# Patient Record
Sex: Female | Born: 1975 | Race: White | Hispanic: No | Marital: Married | Smoking: Former smoker
Health system: Southern US, Community
[De-identification: ages and names within clinical notes are randomized; demographics above are authoritative.]

## PROBLEM LIST (undated history)

## (undated) DIAGNOSIS — K219 Gastro-esophageal reflux disease without esophagitis: Secondary | ICD-10-CM

## (undated) DIAGNOSIS — R011 Cardiac murmur, unspecified: Secondary | ICD-10-CM

## (undated) DIAGNOSIS — F419 Anxiety disorder, unspecified: Secondary | ICD-10-CM

## (undated) DIAGNOSIS — J45909 Unspecified asthma, uncomplicated: Secondary | ICD-10-CM

## (undated) DIAGNOSIS — D649 Anemia, unspecified: Secondary | ICD-10-CM

## (undated) DIAGNOSIS — I341 Nonrheumatic mitral (valve) prolapse: Secondary | ICD-10-CM

## (undated) HISTORY — PX: APPENDECTOMY: SHX54

## (undated) HISTORY — DX: Nonrheumatic mitral (valve) prolapse: I34.1

---
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
## 2002-07-14 ENCOUNTER — Other Ambulatory Visit: Admission: RE | Admit: 2002-07-14 | Discharge: 2002-07-14 | Payer: Self-pay | Admitting: Family Medicine

## 2003-07-06 ENCOUNTER — Other Ambulatory Visit: Admission: RE | Admit: 2003-07-06 | Discharge: 2003-07-06 | Payer: Self-pay | Admitting: Family Medicine

## 2004-05-05 ENCOUNTER — Ambulatory Visit: Payer: Self-pay | Admitting: Family Medicine

## 2004-11-24 ENCOUNTER — Encounter: Payer: Self-pay | Admitting: Family Medicine

## 2004-11-24 ENCOUNTER — Ambulatory Visit: Payer: Self-pay | Admitting: Family Medicine

## 2004-11-24 ENCOUNTER — Other Ambulatory Visit: Admission: RE | Admit: 2004-11-24 | Discharge: 2004-11-24 | Payer: Self-pay | Admitting: Family Medicine

## 2005-09-16 ENCOUNTER — Ambulatory Visit: Payer: Self-pay | Admitting: Family Medicine

## 2006-03-08 ENCOUNTER — Ambulatory Visit: Payer: Self-pay | Admitting: Family Medicine

## 2006-03-08 ENCOUNTER — Other Ambulatory Visit: Admission: RE | Admit: 2006-03-08 | Discharge: 2006-03-08 | Payer: Self-pay | Admitting: Family Medicine

## 2006-03-08 ENCOUNTER — Encounter: Payer: Self-pay | Admitting: Family Medicine

## 2006-03-08 LAB — CONVERTED CEMR LAB: Pap Smear: NORMAL

## 2006-03-26 ENCOUNTER — Ambulatory Visit: Payer: Self-pay | Admitting: Family Medicine

## 2007-01-18 ENCOUNTER — Encounter: Payer: Self-pay | Admitting: Family Medicine

## 2007-01-25 ENCOUNTER — Ambulatory Visit: Payer: Self-pay | Admitting: Family Medicine

## 2007-01-25 DIAGNOSIS — J309 Allergic rhinitis, unspecified: Secondary | ICD-10-CM | POA: Insufficient documentation

## 2007-01-25 DIAGNOSIS — J45909 Unspecified asthma, uncomplicated: Secondary | ICD-10-CM | POA: Insufficient documentation

## 2007-01-25 LAB — CONVERTED CEMR LAB: Beta hcg, urine, semiquantitative: POSITIVE

## 2007-02-01 ENCOUNTER — Ambulatory Visit: Payer: Self-pay | Admitting: Obstetrics & Gynecology

## 2007-02-10 ENCOUNTER — Encounter: Payer: Self-pay | Admitting: Obstetrics & Gynecology

## 2007-02-10 ENCOUNTER — Ambulatory Visit: Payer: Self-pay | Admitting: Obstetrics & Gynecology

## 2007-02-11 ENCOUNTER — Ambulatory Visit (HOSPITAL_COMMUNITY): Admission: RE | Admit: 2007-02-11 | Discharge: 2007-02-11 | Payer: Self-pay | Admitting: Gynecology

## 2007-02-16 ENCOUNTER — Encounter: Payer: Self-pay | Admitting: Family Medicine

## 2007-03-08 ENCOUNTER — Ambulatory Visit (HOSPITAL_COMMUNITY): Admission: RE | Admit: 2007-03-08 | Discharge: 2007-03-08 | Payer: Self-pay | Admitting: Obstetrics & Gynecology

## 2007-03-10 ENCOUNTER — Ambulatory Visit: Payer: Self-pay | Admitting: Obstetrics & Gynecology

## 2007-03-10 ENCOUNTER — Ambulatory Visit (HOSPITAL_COMMUNITY): Admission: RE | Admit: 2007-03-10 | Discharge: 2007-03-10 | Payer: Self-pay | Admitting: Obstetrics & Gynecology

## 2007-04-01 ENCOUNTER — Ambulatory Visit: Payer: Self-pay | Admitting: Family Medicine

## 2007-04-06 ENCOUNTER — Ambulatory Visit: Payer: Self-pay | Admitting: Obstetrics & Gynecology

## 2007-04-08 ENCOUNTER — Ambulatory Visit (HOSPITAL_COMMUNITY): Admission: RE | Admit: 2007-04-08 | Discharge: 2007-04-08 | Payer: Self-pay | Admitting: Obstetrics & Gynecology

## 2007-04-22 ENCOUNTER — Ambulatory Visit (HOSPITAL_COMMUNITY): Admission: RE | Admit: 2007-04-22 | Discharge: 2007-04-22 | Payer: Self-pay | Admitting: Obstetrics & Gynecology

## 2007-04-28 ENCOUNTER — Ambulatory Visit: Payer: Self-pay | Admitting: Obstetrics & Gynecology

## 2007-05-26 ENCOUNTER — Ambulatory Visit: Payer: Self-pay | Admitting: Obstetrics & Gynecology

## 2007-05-30 ENCOUNTER — Ambulatory Visit: Payer: Self-pay | Admitting: Obstetrics & Gynecology

## 2007-05-30 ENCOUNTER — Inpatient Hospital Stay: Admission: AD | Admit: 2007-05-30 | Discharge: 2007-05-30 | Payer: Self-pay | Admitting: Obstetrics & Gynecology

## 2007-06-03 ENCOUNTER — Ambulatory Visit (HOSPITAL_COMMUNITY): Admission: RE | Admit: 2007-06-03 | Discharge: 2007-06-03 | Payer: Self-pay | Admitting: Obstetrics & Gynecology

## 2007-06-28 ENCOUNTER — Ambulatory Visit: Payer: Self-pay | Admitting: Family Medicine

## 2007-07-05 ENCOUNTER — Ambulatory Visit: Payer: Self-pay | Admitting: Family Medicine

## 2007-07-13 ENCOUNTER — Ambulatory Visit: Payer: Self-pay | Admitting: Obstetrics & Gynecology

## 2007-08-10 ENCOUNTER — Ambulatory Visit: Payer: Self-pay | Admitting: Gynecology

## 2007-08-17 ENCOUNTER — Ambulatory Visit: Payer: Self-pay | Admitting: Obstetrics & Gynecology

## 2007-08-26 ENCOUNTER — Inpatient Hospital Stay (HOSPITAL_COMMUNITY): Admission: AD | Admit: 2007-08-26 | Discharge: 2007-08-27 | Payer: Self-pay | Admitting: Obstetrics & Gynecology

## 2007-08-26 ENCOUNTER — Ambulatory Visit: Payer: Self-pay | Admitting: Obstetrics and Gynecology

## 2007-08-29 ENCOUNTER — Ambulatory Visit: Payer: Self-pay | Admitting: Gynecology

## 2007-08-30 ENCOUNTER — Ambulatory Visit (HOSPITAL_COMMUNITY): Admission: RE | Admit: 2007-08-30 | Discharge: 2007-08-30 | Payer: Self-pay | Admitting: Gynecology

## 2007-08-31 ENCOUNTER — Ambulatory Visit: Payer: Self-pay | Admitting: Gynecology

## 2007-09-07 ENCOUNTER — Ambulatory Visit: Payer: Self-pay | Admitting: Gynecology

## 2007-09-08 ENCOUNTER — Ambulatory Visit: Payer: Self-pay | Admitting: Gynecology

## 2007-09-13 ENCOUNTER — Inpatient Hospital Stay (HOSPITAL_COMMUNITY): Admission: RE | Admit: 2007-09-13 | Discharge: 2007-09-16 | Payer: Self-pay | Admitting: Gynecology

## 2007-09-13 ENCOUNTER — Ambulatory Visit: Payer: Self-pay | Admitting: Gynecology

## 2007-09-13 DIAGNOSIS — O1490 Unspecified pre-eclampsia, unspecified trimester: Secondary | ICD-10-CM

## 2007-09-20 ENCOUNTER — Ambulatory Visit: Payer: Self-pay | Admitting: Gynecology

## 2007-10-25 ENCOUNTER — Ambulatory Visit: Payer: Self-pay | Admitting: Obstetrics & Gynecology

## 2007-11-18 ENCOUNTER — Encounter (INDEPENDENT_AMBULATORY_CARE_PROVIDER_SITE_OTHER): Payer: Self-pay | Admitting: *Deleted

## 2007-11-30 ENCOUNTER — Ambulatory Visit: Payer: Self-pay | Admitting: Family Medicine

## 2007-11-30 DIAGNOSIS — B351 Tinea unguium: Secondary | ICD-10-CM | POA: Insufficient documentation

## 2007-11-30 DIAGNOSIS — M6530 Trigger finger, unspecified finger: Secondary | ICD-10-CM | POA: Insufficient documentation

## 2008-02-15 ENCOUNTER — Ambulatory Visit: Payer: Self-pay | Admitting: Obstetrics and Gynecology

## 2008-02-15 ENCOUNTER — Encounter: Payer: Self-pay | Admitting: Obstetrics and Gynecology

## 2008-03-15 ENCOUNTER — Ambulatory Visit: Payer: Self-pay | Admitting: Family Medicine

## 2008-03-15 DIAGNOSIS — I498 Other specified cardiac arrhythmias: Secondary | ICD-10-CM | POA: Insufficient documentation

## 2008-03-21 ENCOUNTER — Encounter: Payer: Self-pay | Admitting: Family Medicine

## 2008-04-02 ENCOUNTER — Ambulatory Visit: Payer: Self-pay | Admitting: Family Medicine

## 2008-04-02 DIAGNOSIS — M542 Cervicalgia: Secondary | ICD-10-CM | POA: Insufficient documentation

## 2008-04-02 DIAGNOSIS — M722 Plantar fascial fibromatosis: Secondary | ICD-10-CM | POA: Insufficient documentation

## 2008-04-02 DIAGNOSIS — R4589 Other symptoms and signs involving emotional state: Secondary | ICD-10-CM | POA: Insufficient documentation

## 2008-12-26 ENCOUNTER — Encounter: Payer: Self-pay | Admitting: Family Medicine

## 2009-03-27 ENCOUNTER — Ambulatory Visit: Payer: Self-pay | Admitting: Obstetrics and Gynecology

## 2009-10-26 IMAGING — US US OB COMP LESS 14 WK
1 series · 6 of 6 positions shown · non-contrast
Comparison: none

OBSTETRICAL ULTRASOUND:
 This ultrasound was performed in The [HOSPITAL], and the [facility] report will be stored to [REDACTED] PACS.

[Series 1: us ob comp less 14 wk · 6 of 6 slices shown]
[im 1/6]
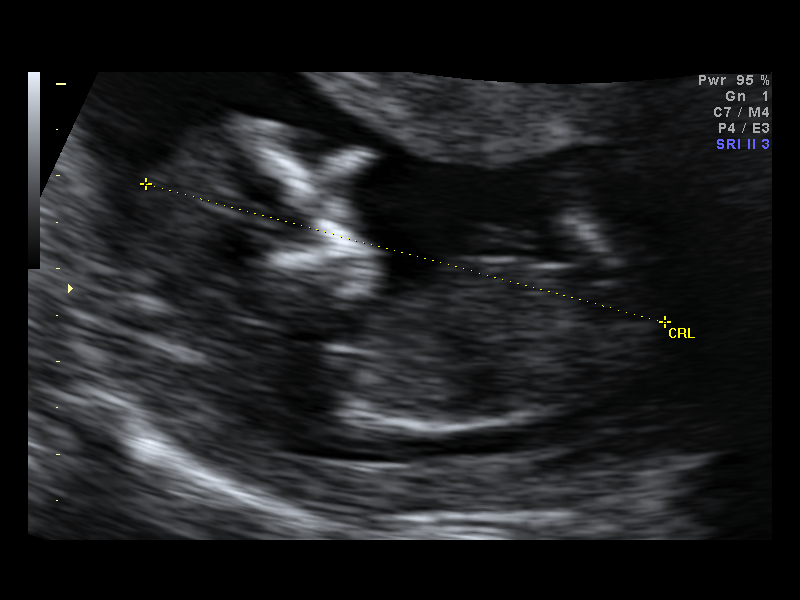
[im 2/6]
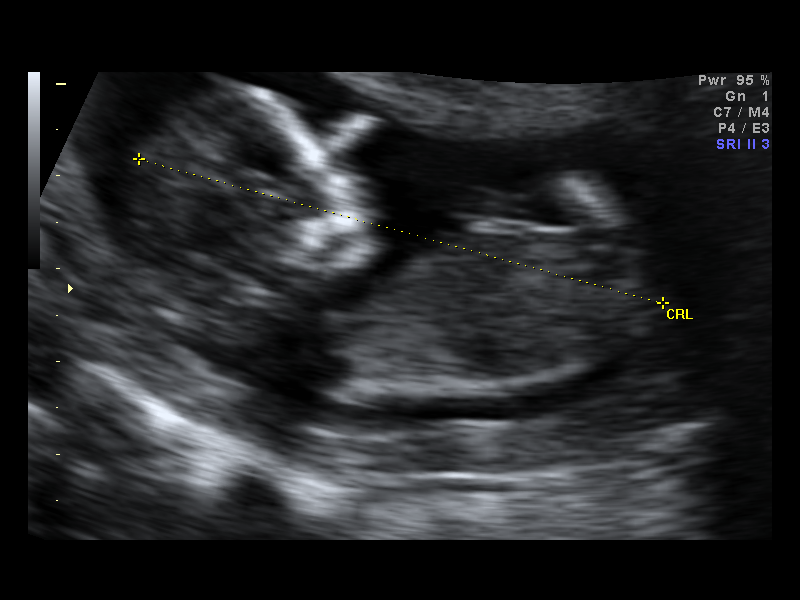
[im 3/6]
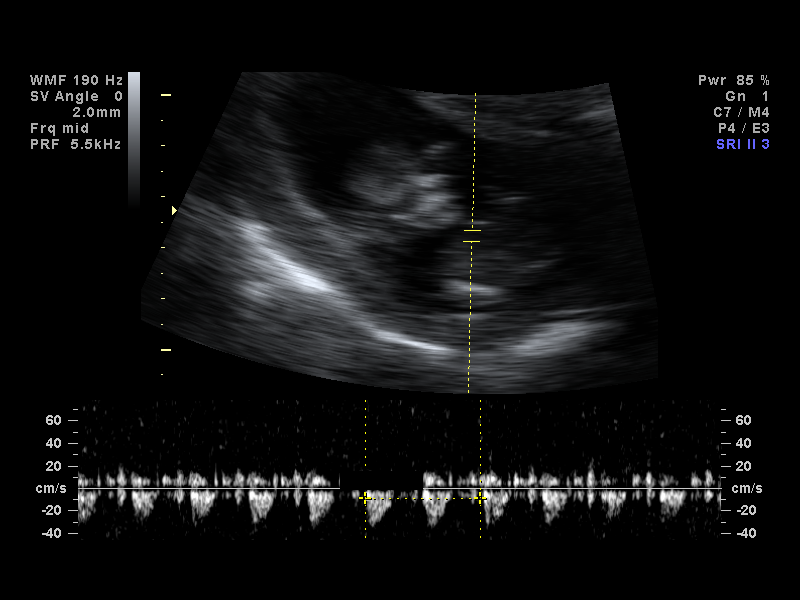
[im 4/6]
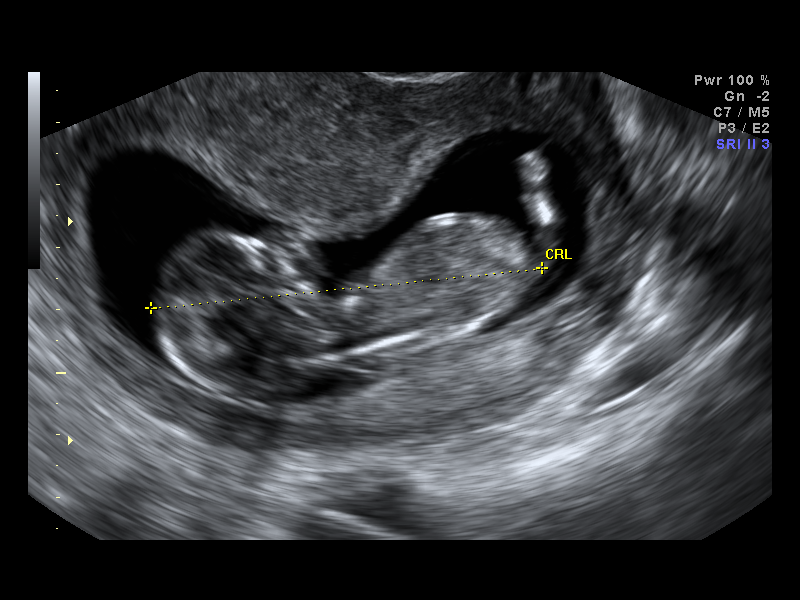
[im 5/6]
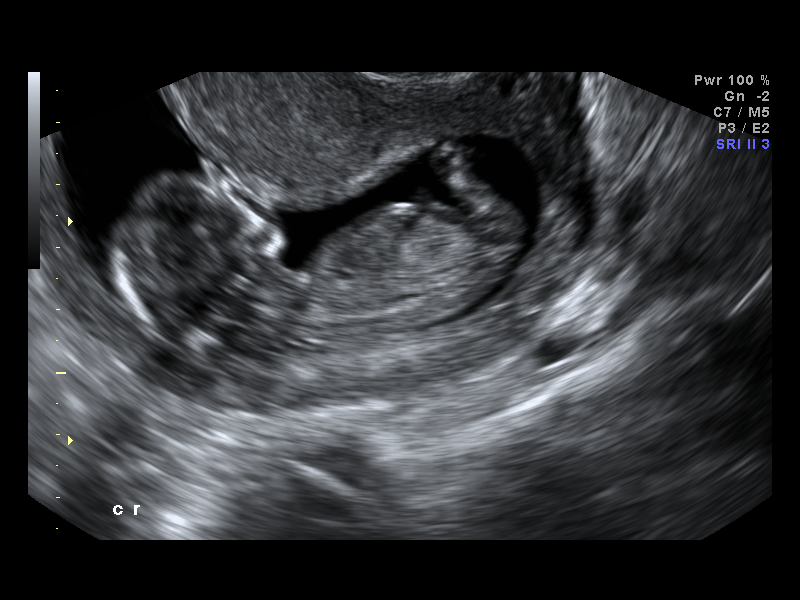
[im 6/6]
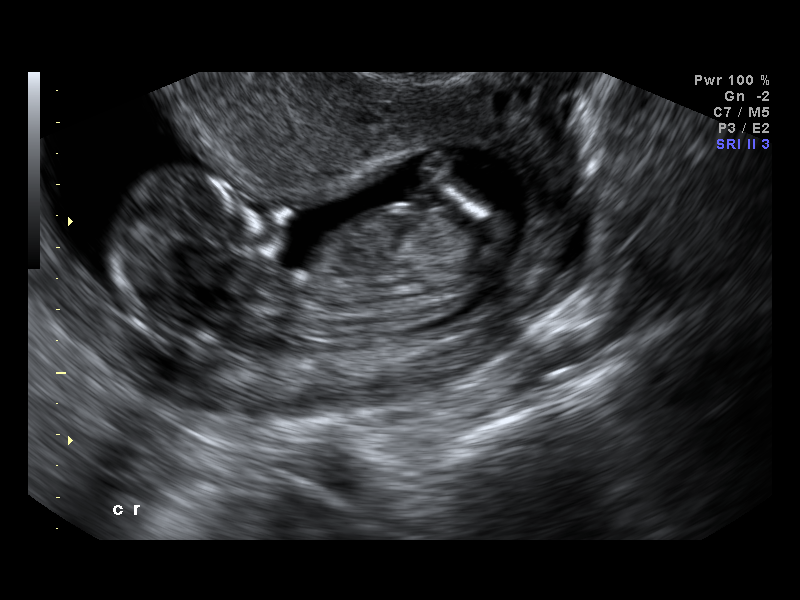

[6 of 6 positions shown; findings below may reference images not displayed]

IMPRESSION: The [facility] report has also been faxed to the ordering physician.

## 2010-03-06 NOTE — Letter (Signed)
[facility]   Imported By: Maryln Gottron 01/28/2009 14:23:48  _____________________________________________________________________  External Attachment:    Type:   Image     Comment:   External Document

## 2010-03-06 NOTE — Miscellaneous (Signed)
Summary: flu vaccine  Clinical Lists Changes  Observations: Added new observation of FLU VAX: Historical (11/16/2007 12:56)      Influenza Immunization History:    Influenza # 1:  Historical (11/16/2007)  given im L deltoid lot # [id number] Liane Comber  November 18, 2007 12:57 PM

## 2010-03-06 NOTE — Consult Note (Signed)
Summary: [facility]/Dr. Lonzo Candy  [facility]/Dr. Lonzo Candy   Imported By: Eleonore Chiquito 04/17/2008 12:36:15  _____________________________________________________________________  External Attachment:    Type:   Image     Comment:   External Document

## 2010-03-06 NOTE — Consult Note (Signed)
Summary: [facility]/Consultation Report/Dr. Jerral [facility]/Consult[facility]/Dr. Minor Hill Callas   Imported By: Mickle Asper 02/04/2007 10:58:48  _____________________________________________________________________  External Attachment:    Type:   Image     Comment:   External Document

## 2010-03-06 NOTE — Assessment & Plan Note (Signed)
Summary: sole spur/shoulder pain/anxiety/rbh   Vital Signs:  Patient Profile:   35 Years Old Female Weight:      184 pounds Temp:     97.3 degrees F oral Pulse rate:   48 / minute Pulse rhythm:   regular BP sitting:   110 / 80  (left arm) Cuff size:   regular  Vitals Entered By: Liane Comber (April 02, 2008 3:58 PM)                 Chief Complaint:  sole spur, shoulder pain, and anxiery.  History of Present Illness: will be starting INH for 9 mo for pos PPD -- (? if exp to it [place])  2 weeks ago- fell down several stairs- backwards- at time was ok  now is having some shoulder soreness R  pain is posterior generally- and bothers her more at night  heating pad and icy hot does help  no pain with raising her arm above her head   R heel- has been bothering her on occasion- sharp pain when she gets up after sitting -- has to warm up and then ok (is aggrivating)  no swelling or skin change at all  no numbness or weakness  ? if has flat feet  thinks she has some anxiety increased stress- is going to school and works full time (job is stressful) and 6 mo old child is considering moving as well   few times has been tearful and difficult to console  anger at times  not generally nervous , but may be feels a little down and negative  no anxiety attacks  no hx of dep or anx or med or counseling in the past  has never been this overwhelmed  good support in husband and mother  no time to relax/ down time  is sleeping fine , is eating fine , and has no suicidal thoughts         Current Allergies (reviewed today): SEPTRA  Past Medical History:    Allergic rhinitis    Asthma    stress reaction/anx     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      Denies loss of appetite, malaise, and sleep disorder.  Eyes      Denies blurring, double vision, and eye irritation.  ENT      Complains of postnasal drainage and sore throat.      a little drip and st this am- ? cold or allergies   CV      Denies chest pain or discomfort, palpitations, and swelling of feet.  Resp      Denies cough, shortness of breath, and wheezing.  GI      Denies abdominal pain, bloody stools, change in bowel habits, diarrhea, indigestion, and nausea.  MS      Complains of stiffness.      Denies joint redness and joint swelling.  Derm      Denies itching, lesion(s), and rash.  Neuro      Denies headaches, numbness,  tingling, and tremors.  Psych      Complains of anxiety, easily tearful, and irritability.      Denies panic attacks, sense of great danger, and suicidal thoughts/plans.  Endo      Denies excessive thirst and excessive urination.  Heme      Denies pallor and skin discoloration.   Physical Exam  General:     Well-developed,well-nourished,in no acute distress; alert,appropriate and cooperative throughout examination Head:     normocephalic, atraumatic, and no abnormalities observed.  no sinus tenderness  Eyes:     vision grossly intact, pupils equal, pupils round, and pupils reactive to light.  no conjunctival pallor, injection or icterus  Ears:     R ear normal and L ear normal.   Nose:     mild congestion- clear rhinorrhea  Mouth:     pharynx pink and moist.   Neck:     supple with full rom and no masses or thyromegally, no JVD or carotid bruit  Lungs:     Normal respiratory effort, chest expands symmetrically. Lungs are clear to auscultation, no crackles or wheezes. Heart:     Normal rate and regular rhythm. S1 and S2 normal without gallop, murmur, click, rub or other extra sounds. Abdomen:     Bowel sounds positive,abdomen soft and  non-tender without masses, organomegaly or hernias noted. Msk:     some tenderness of R trapezius muscle  pain on flex/ and L rot neck  nl rom shoulder  no bony tenderness  R heel- slt tender- without redness/swelling or skin change  nl gait adequate arches Pulses:     R and L carotid,radial,femoral,dorsalis pedis and posterior tibial pulses are full and equal bilaterally Extremities:     No clubbing, cyanosis, edema, or deformity noted with normal full range of motion of all joints.   Neurologic:     cranial nerves II-XII intact, sensation intact to light touch, gait normal, and DTRs symmetrical and normal.   Skin:     Intact without suspicious lesions or rashes Cervical Nodes:     No lymphadenopathy noted Inguinal Nodes:     No significant adenopathy Psych:     seems generally fatigued and overwhelmed not tearful good eye contact and comm skills      Impression & Recommendations:  Problem # 1:  STRESS REACTION, ACUTE, WITH EMOTIONAL DISTURBANCE (ICD-308.0) Assessment: New with some intermittent feelings of being overwhelmed / feeling down overall good support  disc coping strategies/ symptoms/ situational stress in detail today- also rev symptoms of depression to watch for  offered counseling - pt declined now but will consider (time is factor) encouraged venting to family/ keeping journal/exercise given as needed alprazolam to use sparingly as needed and update   Problem # 2:  NECK PAIN (ICD-723.1) Assessment: New exam consistent with trapezius strain/sprain after fall recommended stretches/ cervical supp pillow/ heat flexeril as needed night if not imp 1-2 weeks consider addn eval/ PT  Her updated medication list for this problem includes:    Flexeril 10 Mg Tabs (Cyclobenzaprine hcl) .Marland Kitchen... 1/2 to 1 by mouth three times a day as needed neck/shoulder pain   Problem # 3:  PLANTAR FASCIITIS, RIGHT (ICD-728.71) Assessment: New R heel- mild symptoms  adv use of  hard soled shoe/ avoid barefoot/ ice/massage will avoid nsaid at this time in light of impending INH use  update if not imp  Problem # 4:  NONSPEC REACT TUBERCULIN SKN TEST W/O ACTV TB (ICD-795.5) Assessment: Unchanged per  pulm- to start INH soon no s/s of active TB  Complete Medication List: 1)  Zyrtec Allergy 10 Mg Tabs (Cetirizine hcl) .... Take one by mouth daily 2)  Isoniazid  .... As directed 3)  Flexeril 10 Mg Tabs (Cyclobenzaprine hcl) .... 1/2 to 1 by mouth three times a day as needed neck/shoulder pain 4)  Alprazolam 0.5 Mg Tabs (Alprazolam) .Marland Kitchen.. 1 by mouth once daily as needed severe anxiety 5)  Nuvaring 0.12-0.015 Mg/24hr Ring (Etonogestrel-ethinyl estradiol) .... As directed   Patient Instructions: 1)  for stress - talk to supportive friends and family members and start writing in a journal , also regular exercise is helpful  2)  use xanax for severe anxiety or emergencies 3)  update me if symptoms of depression worsen or do not improve 4)  call if you are interested in a counseling referral in the future  5)  try the flexeril at night as needed for shoulder/ neck pain- and continue heat  6)  a cervical support pillow made of foam- may also be helpful 7)  for heel pain - wear hard soled shoes and avoid going barefoot  8)  ice (rolling foot over frozen can)- is also hepful several times per day    Prescriptions: ALPRAZOLAM 0.5 MG TABS (ALPRAZOLAM) 1 by mouth once daily as needed severe anxiety  #10 x 0   Entered and Authorized by:   Judith Part MD   Signed by:   Judith Part MD on 04/02/2008   Method used:   Print then Give to Patient   RxID:   [id number] FLEXERIL 10 MG TABS (CYCLOBENZAPRINE HCL) 1/2 to 1 by mouth three times a day as needed neck/shoulder pain  #30 x 0   Entered and Authorized by:   Judith Part MD   Signed by:   Judith Part MD on 04/02/2008   Method used:   Print then Give to Patient   RxID:   [id number]   Prior  Medications (reviewed today): ZYRTEC ALLERGY 10 MG  TABS (CETIRIZINE HCL) take one by mouth daily Current Allergies (reviewed today): SEPTRA Current Medications (including changes made in today's visit):  ZYRTEC ALLERGY 10 MG  TABS (CETIRIZINE HCL) take one by mouth daily * ISONIAZID as directed FLEXERIL 10 MG TABS (CYCLOBENZAPRINE HCL) 1/2 to 1 by mouth three times a day as needed neck/shoulder pain ALPRAZOLAM 0.5 MG TABS (ALPRAZOLAM) 1 by mouth once daily as needed severe anxiety NUVARING 0.12-0.015 MG/24HR RING (ETONOGESTREL-ETHINYL ESTRADIOL) as directed

## 2010-03-06 NOTE — Letter (Signed)
Summary: [facility]  [facility]   Imported By: Lanelle Bal 01/03/2009 11:50:31  _____________________________________________________________________  External Attachment:    Type:   Image     Comment:   External Document

## 2010-03-06 NOTE — Assessment & Plan Note (Signed)
Summary: postive tb skin test/[facility]h   Vital Signs:  Patient Profile:   35 Years Old Female Weight:      183 pounds Temp:     97.9 degrees F oral Pulse rate:   68 / minute Pulse rhythm:   regular BP sitting:   110 / 80  (left arm) Cuff size:   regular  Vitals Entered ByProvidence Crosby (March 15, 2008 12:13 PM)                 Chief Complaint:  POSITIVE TB TEST [facility].  History of Present Illness: Pt here foir "positive " PPD placement by her local Pharmacy and read as 18mm.  Her site is mildly erythematous and minimally homogeneously raised, not really indurated. She denies cough, nightsweats, fever, fatigue, weightloss  or SOB.  She also typically has a  heartrate in the 50's, is not an athlete and woders if something should be done. She is assymptomatic, denies chest pai, palpitations or SOB.    Prior Medications Reviewed Using: Patient Recall  Current Allergies (reviewed today): SEPTRA      Physical Exam  Skin:     14x18 homogeneously mildly erythematous mildly raised nonindurated area of her left mid forearm.    Impression & Recommendations:  Problem # 1:  NONSPEC REACT TUBERCULIN SKN TEST W/O ACTV TB (ICD-795.5) Assessment: New Think her risk of TB low, not true induration on her PPD. Will refer to [facility] for deciding need for trmt and approach. Orders: Pulmonary Referral (Pulmonary)   Problem # 2:  BRADYCARDIA, CHRONIC (ICD-427.89) Assessment: Unchanged Do not think any 9nvestigation warranted as pt assymptomatic and her relative bradycardia has been lifelong w/o complication. Reassured.  Complete Medication List: 1)  Zyrtec Allergy 10 Mg Tabs (Cetirizine hcl) .... Take one by mouth daily   Patient Instructions: 1)  Refer to Pulm. 2)  RTC as needed

## 2010-03-06 NOTE — Assessment & Plan Note (Signed)
Summary: R THUMB/CLE   Vital Signs:  Patient Profile:   35 Years Old Female Weight:      184.13 pounds (83.70 kg) Temp:     98.1 degrees F (36.72 degrees C) oral Pulse rate:   72 / minute Pulse rhythm:   regular BP sitting:   120 / 80  (left arm) Cuff size:   regular  Vitals Entered By: Silas Sacramento (November 30, 2007 8:45 AM)                 Chief Complaint:  R thumb hard to bend worse  in morning.  History of Present Illness: 35 year old female presents with right thumb discomfort: triggers at night and causes some pain. No specific trauma or injury to her knowledge. Is only at the right 1st digit and triggers at the A1 pulley, but also causes some pain distal.  Patient was pregnant had baby twelve weeks ago. Hands swollen a lot at that time.  Thumb did swell more.  When she wakes up in the morning, thumb hurts more.   2. L great toenail fungus, ongoing for months. Has not tried any remedy OTC.     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  Otherwise the pertinent positives of the ROS are noted above. Further ROS may be demarcated in the ROS section of Centricity, If none, then this plus the HPI constitutes the ROS.     Physical Exam  Gen: Well-developed,well-nourished,in no acute distress; alert,appropriate and cooperative throughout examination  HEENT: Normocephalic and atraumatic without obvious abnormalities. No apparent alopecia  or balding. Ears, externally no deformities  Pulm: Breathing comfortably in no respiratory distress  Ext: No clubbing, cyanosis, or edema  Psych: Normally interactive. Cooperative during the interview. Pleasant. Friendly and conversant. Not anxious or depressed appearing. Normal, full affect.   Right hand Ecchymosis or edema: neg ROM wrist/hand/digits: full  Carpals, MCP's, digits: NT Distal Ulna and Radius: NT Ecchymosis or edema: neg No instability Cysts/nodules: neg Digit triggering: 1st digit Finkelstein's test: neg Snuffbox tenderness: neg Scaphoid tubercle: NT Full composite fist, no malrotation Grip, all digits: 5/5 str DIPJT: NT PIP JT: NT MCP JT: NT No tenosynovitis Axial load test: neg Atrophy: neg  Hand sensation: intact   Left great toe with fungus apparent    Impression & Recommendations:  Problem # 1:  TRIGGER FINGER (ICD-727.03) Discussed anatomy, reviewed with anatomical drawings.  Will inject, if not better after 1 injection, if fails 2, would refer for trigger finger release.  The patient was given a handout from Dr. Ailene Ards book "The Sports Medicine Patient Advisor" describing the anatomy and rehabilitation of the following condition: Trigger Finger  Trigger Finger Injection Verbal consent was obtained. Risks, benefits and alternatives were discussed. Prepped with Betadine and Ethyl Chloride used for anesthesia. Under sterile conditions,  patient injected at palmar crease aiming distally with 45 degree angle towards nodule. Medication flowed freely without resistance.  Needle size: 22 gauge 1 1/2 inch Injection: 1/2 cc of Marcaine 0.5% and 1/2 cc of Kenalog 40 mg   Orders: Injection, Tendon / Ligament (20550) Kenalog 10 mg inj (J3301)   Problem # 2:  DERMATOPHYTOSIS OF NAIL (ICD-110.1) Onychomycosis  OTC Vicks VapoRub and Tea tree oil Reviewed risks and benefits of oral meds.  Complete Medication List: 1)  Zyrtec Allergy 10 Mg Tabs  (Cetirizine hcl) .... Take one by mouth daily 2)  Bl Prenatal Vitamins Tabs (Prenatal vit-fe fumarate-fa) .Marland Kitchen.. 1 by mouth qd    ]  Problems:  Problems Added: 1)  Dx of Dermatophytosis of Nail  (ICD-110.1) 2)  Dx of Trigger Finger  (ICD-727.03)

## 2010-03-06 NOTE — Consult Note (Signed)
Summary: [facility] / EVAL & MGMT OF A POSITIVE PPD / DR. Lynann Beaver FAR  [facility] / EVAL & MGMT OF A POSITIVE PPD / DR. Lynann Beaver FARES   Imported By: Carin Primrose 03/23/2008 12:05:06  _____________________________________________________________________  External Attachment:    Type:   Image     Comment:   External Document

## 2010-03-24 ENCOUNTER — Other Ambulatory Visit: Payer: Self-pay | Admitting: Family Medicine

## 2010-03-24 ENCOUNTER — Encounter: Payer: Self-pay | Admitting: Family Medicine

## 2010-03-24 DIAGNOSIS — Z3480 Encounter for supervision of other normal pregnancy, unspecified trimester: Secondary | ICD-10-CM

## 2010-03-24 DIAGNOSIS — O3680X0 Pregnancy with inconclusive fetal viability, not applicable or unspecified: Secondary | ICD-10-CM

## 2010-03-26 LAB — CONVERTED CEMR LAB
Antibody Screen: NEGATIVE
Basophils Absolute: 0 10*3/uL (ref 0.0–0.1)
Basophils Relative: 1 % (ref 0–1)
Eosinophils Absolute: 0.3 10*3/uL (ref 0.0–0.7)
Eosinophils Relative: 5 % (ref 0–5)
HCT: 40.6 % (ref 36.0–46.0)
HIV: NONREACTIVE
Hemoglobin: 14 g/dL (ref 12.0–15.0)
Hepatitis B Surface Ag: NEGATIVE
Lymphocytes Relative: 27 % (ref 12–46)
Lymphs Abs: 1.6 10*3/uL (ref 0.7–4.0)
MCHC: 34.5 g/dL (ref 30.0–36.0)
MCV: 87.5 fL (ref 78.0–100.0)
Monocytes Absolute: 0.7 10*3/uL (ref 0.1–1.0)
Monocytes Relative: 12 % (ref 3–12)
Neutro Abs: 3.3 10*3/uL (ref 1.7–7.7)
Neutrophils Relative %: 56 % (ref 43–77)
Platelets: 240 10*3/uL (ref 150–400)
RBC: 4.64 M/uL (ref 3.87–5.11)
RDW: 12.6 % (ref 11.5–15.5)
Rh Type: POSITIVE
Rubella: 219.3 intl units/mL — ABNORMAL HIGH
WBC: 5.8 10*3/uL (ref 4.0–10.5)

## 2010-03-28 ENCOUNTER — Encounter (HOSPITAL_COMMUNITY): Payer: Self-pay

## 2010-03-28 ENCOUNTER — Ambulatory Visit (HOSPITAL_COMMUNITY)
Admission: RE | Admit: 2010-03-28 | Discharge: 2010-03-28 | Disposition: A | Discharge status: Home / Self Care | Payer: Private Health Insurance - Indemnity | Source: Ambulatory Visit | Attending: Family Medicine | Admitting: Family Medicine

## 2010-03-28 DIAGNOSIS — Z3689 Encounter for other specified antenatal screening: Secondary | ICD-10-CM | POA: Insufficient documentation

## 2010-03-28 DIAGNOSIS — O3680X0 Pregnancy with inconclusive fetal viability, not applicable or unspecified: Secondary | ICD-10-CM

## 2010-04-03 ENCOUNTER — Other Ambulatory Visit: Payer: Self-pay | Admitting: Obstetrics & Gynecology

## 2010-04-03 ENCOUNTER — Encounter: Payer: Private Health Insurance - Indemnity | Admitting: Obstetrics & Gynecology

## 2010-04-03 DIAGNOSIS — Z113 Encounter for screening for infections with a predominantly sexual mode of transmission: Secondary | ICD-10-CM

## 2010-04-03 DIAGNOSIS — Z3682 Encounter for antenatal screening for nuchal translucency: Secondary | ICD-10-CM

## 2010-04-03 DIAGNOSIS — Z3480 Encounter for supervision of other normal pregnancy, unspecified trimester: Secondary | ICD-10-CM

## 2010-04-03 DIAGNOSIS — Z1272 Encounter for screening for malignant neoplasm of vagina: Secondary | ICD-10-CM

## 2010-04-22 ENCOUNTER — Encounter: Payer: Self-pay | Admitting: Obstetrics & Gynecology

## 2010-04-22 ENCOUNTER — Other Ambulatory Visit: Payer: Private Health Insurance - Indemnity

## 2010-04-22 DIAGNOSIS — N390 Urinary tract infection, site not specified: Secondary | ICD-10-CM

## 2010-04-22 DIAGNOSIS — Z3480 Encounter for supervision of other normal pregnancy, unspecified trimester: Secondary | ICD-10-CM

## 2010-04-30 ENCOUNTER — Encounter: Payer: Private Health Insurance - Indemnity | Admitting: Obstetrics & Gynecology

## 2010-04-30 DIAGNOSIS — Z3480 Encounter for supervision of other normal pregnancy, unspecified trimester: Secondary | ICD-10-CM

## 2010-05-07 ENCOUNTER — Ambulatory Visit (HOSPITAL_COMMUNITY)
Admission: RE | Admit: 2010-05-07 | Discharge: 2010-05-07 | Disposition: A | Discharge status: Home / Self Care | Payer: Private Health Insurance - Indemnity | Source: Ambulatory Visit | Attending: Obstetrics & Gynecology | Admitting: Obstetrics & Gynecology

## 2010-05-07 ENCOUNTER — Other Ambulatory Visit: Payer: Self-pay | Admitting: Obstetrics & Gynecology

## 2010-05-07 DIAGNOSIS — Z3682 Encounter for antenatal screening for nuchal translucency: Secondary | ICD-10-CM

## 2010-05-07 DIAGNOSIS — O351XX0 Maternal care for (suspected) chromosomal abnormality in fetus, not applicable or unspecified: Secondary | ICD-10-CM | POA: Insufficient documentation

## 2010-05-07 DIAGNOSIS — Z3689 Encounter for other specified antenatal screening: Secondary | ICD-10-CM | POA: Insufficient documentation

## 2010-05-07 DIAGNOSIS — IMO0002 Reserved for concepts with insufficient information to code with codable children: Secondary | ICD-10-CM

## 2010-05-07 DIAGNOSIS — Z0489 Encounter for examination and observation for other specified reasons: Secondary | ICD-10-CM

## 2010-05-07 DIAGNOSIS — O3510X0 Maternal care for (suspected) chromosomal abnormality in fetus, unspecified, not applicable or unspecified: Secondary | ICD-10-CM | POA: Insufficient documentation

## 2010-05-29 ENCOUNTER — Encounter (INDEPENDENT_AMBULATORY_CARE_PROVIDER_SITE_OTHER): Payer: Private Health Insurance - Indemnity | Admitting: Obstetrics & Gynecology

## 2010-05-29 DIAGNOSIS — O34219 Maternal care for unspecified type scar from previous cesarean delivery: Secondary | ICD-10-CM

## 2010-05-29 DIAGNOSIS — Z3480 Encounter for supervision of other normal pregnancy, unspecified trimester: Secondary | ICD-10-CM

## 2010-06-17 NOTE — Op Note (Signed)
NAME:  April Daniels, April Daniels NO.:  [id number]   MEDICAL RECORD NO.:  [id number]         PATIENT TYPE:  WINP   LOCATION:                                FACILITY:  [facility]   PHYSICIAN:  Ginger Carne, MD  DATE OF BIRTH:  1975/07/29   DATE OF PROCEDURE:  09/13/2007  DATE OF DISCHARGE:                               OPERATIVE REPORT   DATE OF PROCEDURE:  September 13, 2007   TIME:  3:00 p.m.   PREOPERATIVE DIAGNOSIS:  Term intrauterine pregnancy at 39 weeks.   POSTOPERATIVE DIAGNOSIS:  Term intrauterine pregnancy at 39 weeks.   PROCEDURE:  Elective primary low-transverse cesarean section.   SURGEON:  Ginger Carne, MD.   ASSISTANT:  Odie Sera, DO.   ANESTHESIA:  Spinal.   OPERATIVE FINDINGS:  Term female infant delivered in vertex position.  Apgar and weights per delivery and record.  No gross abnormalities  noted.  Baby cried spontaneously at delivery.  Amniotic was clear, not  foul-smelling.  Three-vessel cord, central insertion, complete placenta,  uterus, fallopian tubes, and ovaries showed normal changes of pregnancy.   OPERATIVE PROCEDURE:  The patient was taken to the operating room where  a time-out was conducted.  The patient was prepped and draped in the  usual fashion and placed in the left lateral supine position.  Betadine  solution was used for antiseptic.  The patient was catheterized prior to  the procedure.  Time-out was conducted.  After adequate spinal analgesia  was confirmed, a Pfannenstiel incision was made.  The abdomen was opened  and the incision was carried down to the underlying layer of fascia.  The fascia and then the peritoneum was entered and a bladder flap was  developed.  The lower uterine segment was incised transversely.  The  baby was delivered with the assistance of a Kiwi vacuum.  The cord was  clamped and cut and the infant was given to the pediatric staff after  bulb suction.  Placenta was removed manually.  The uterus  was inspected.  The uterine musculature was closed in one layer with 0 Vicryl running  interlocking suture.  Bleeding points were hemostatically checked.  Blood clots were removed and we closed the fascia with double looped 0  PDS running suture and the skin was closed with a  subcutaneous 3-0 Vicryl suture.  Instrument, needle, and sponge counts  were correct x2.  The patient tolerated the procedure well and returned  to postanesthesia recovery room in good condition.  Estimated blood loss  1000 mL.  Complications none immediate.      Odie Sera, DO  Electronically Signed     ______________________________  Ginger Carne, MD    MC/MEDQ  D:  09/13/2007  T:  09/14/2007  Job:  [id number]

## 2010-06-17 NOTE — Assessment & Plan Note (Signed)
NAME:  April Daniels, April Daniels NO.:  [id number]   MEDICAL RECORD NO.:  [id number]          PATIENT TYPE:  POB   LOCATION:  [facility]         [facility]   PHYSICIAN:  Catalina Antigua, MD     DATE OF BIRTH:  05-08-1975   DATE OF SERVICE:  03/27/2009                                  CLINIC NOTE   This is a 35 year old G1, P78 with LMP of March 15, 2009, who presents  today for annual exam.  The patient is currently without any complaints  and is using Tri-Sprintec for birth control.  The patient denies any  abnormal bleeding, discharge or pelvic pain.   PAST MEDICAL HISTORY:  Significant for asthma.   PAST SURGICAL HISTORY:  Significant for C-section in 2009.   PAST OBSTETRICAL HISTORY:  She has had a C-section x1.   PAST GYNECOLOGIC HISTORY:  She denies any cyst or fibroids or history of  abnormal Pap smear.  She is currently in a monogamous relationship using  Tri-Sprintec for birth control.   FAMILY HISTORY:  Father with diabetes and high blood pressure and her  mother has Graves disease and high blood pressure.   SOCIAL HISTORY:  She denies drinking, smoke or any use of illicit drugs.   REVIEW OF SYSTEMS:  Otherwise within normal limits.   PHYSICAL EXAMINATION:  VITAL SIGNS:  Her blood pressure is 124/90, pulse  of 70, weight of 180 pounds, height of 5 feet 3 inches.  LUNGS:  Clear to auscultation bilaterally.  HEART:  Regular rate and rhythm with occasional extra systolic beat  secondary to her mitral valve prolapse.  BREASTS:  Symmetric.  No skin dimpling.  No palpable masses or  lymphadenopathy.  No expressible nipple discharge.  ABDOMEN:  Soft, nontender, nondistended.  PELVIC:  She had normal external genitalia, normal appearing vaginal  mucosa and cervix.  She has small anteverted uterus.  No palpable  adnexal masses or tenderness.   ASSESSMENT AND PLAN:  A 35 year old G1, P1 who is here for annual exam.  Pap smear was performed.   Prescription for Tri-Sprintec was provided for  a year.  The patient will be contacted with any abnormal results.  The  patient is to return in a year or p.r.n.           ______________________________  Catalina Antigua, MD     PC/MEDQ  D:  03/27/2009  T:  03/27/2009  Job:  [id number]

## 2010-06-17 NOTE — Assessment & Plan Note (Signed)
NAME:  LORAIN, FETTES.:  [id number]   MEDICAL RECORD NO.:  [id number]          PATIENT TYPE:  POB   LOCATION:  [facility]         [facility]ACILITY:  [facility]   PHYSICIAN:  Argentina Donovan, MD        DATE OF BIRTH:  1975-06-04   DATE OF SERVICE:  02/15/2008                                  CLINIC NOTE   The patient is a 35 year old Caucasian female gravida 1, para 1-0-0-1 by  C-section 5 months ago, in for routine annual exam, has no complaints,  started using the NuvaRing approximately two cycles ago and has been  happy with it.  The method of American and European method had been  discussed with the patient.  She has no other physical complaints.  She  does have a history of mitral valve prolapse.   REVIEW OF SYSTEMS:  Negative.   PHYSICAL EXAMINATION:  VITAL SIGNS:  The patient's blood pressure is  112/86, height is 5 feet 3 inches, she weighs 187 pounds, and her pulse  is 69 per minute.  GENERAL:  Well-developed slightly obese white female in no acute  distress.  HEENT:  Within normal limits.  NECK:  Supple.  Thyroid is symmetrical with no dominant masses.  BACK:  Erect.  BREASTS:  Symmetrical.  No nipple discharge.  No dominant masses.  No  axillary or supraclavicular nodes palpable.  HEART:  No murmur, but the occasional extrasystole with an apical rate  of 68.  ABDOMEN:  Soft, flat, nontender.  No masses or organomegaly.  LUNGS:  Clear to auscultation and percussion.  EXTREMITIES:  No edema.  No varicosities.  EXTERNAL GENITALIA:  Normal.  BUS within normal limits.  Vagina is clean  and well rugated.  The cervix is clean, well epithelialized, and  nulliparous.  Uterus is anterior of normal size, shape, and consistency.  The adnexa could not be palpated because of the habitus of the patient.   IMPRESSION:  Normal gynecological examination, mitral valve prolapse.           ______________________________  Argentina Donovan, MD     PR/MEDQ  D:  02/15/2008   T:  02/15/2008  Job:  [id number]

## 2010-06-17 NOTE — H&P (Signed)
NAME:  April Daniels, April Daniels NO.:  [id number]   MEDICAL RECORD NO.:  [id number]          PATIENT TYPE:  POB   LOCATION:  [facility]                          FACILITY:  [facility]   PHYSICIAN:  Ginger Carne, MD  DATE OF BIRTH:  September 04, 1975   DATE OF ADMISSION:  09/08/2007  DATE OF DISCHARGE:                              HISTORY & PHYSICAL   April Daniels is a 35 year old gravida 1, para 0, [facility] September 20, 2007, 39-  0/7 weeks' gestation based on a first trimester ultrasound at 8-3/7  weeks, dating showed the patient at September 20, 2007, which was  consistent with her last menstrual period of December 14, 2006.  The  patient desires an elective cesarean section.  She is a transient  hypertensive whereby blood pressures began to rise on August 29, 2007.  On  September 07, 2007, her blood pressure was 152/99 and on September 08, 2007,  156/94.  The patient specifically had no pre-eclamptic symptomatology.  Her 24-hour urine protein dated September 02, 2007, was 39.  The patient  denies headaches, blurry vision, right upper quadrant pain, or  epigastric pain.  The patient does not desire to be induced and after a  thorough discussion about the pros and cons of an elective cesarean  section as opposed to an induction process with anticipated vaginal  birth, the patient has decided on the former.  At the time of this  dictation, the patient was supposed to come in today for a repeat blood  pressure, which she has not done.  She is scheduled for an elective  primary cesarean section on the September 13, 2007.   OB/GYN HISTORY:  The patient is O positive, rubella immune, HIV  nonreactive, hepatitis B surface antigen negative.  Group B strep  obtained at 37 weeks was negative.  The patient's GC and Chlamydia were  also negative.  Her 1-hour glucola in the mid second trimester was 111.   ALLERGIES:  No to iodine, latex.  The patient is allergic to SULFA.   CURRENT MEDICATIONS:  Prenatal vitamins, Prevacid  20 mg daily, albuterol  inhaler as needed, and Zyrtec 10 mg daily as needed.   MEDICAL HISTORY:  Asthma, transient hypertension, and mitral valve  prolapse.   PAST SURGICAL HISTORY:  Noncontributory.   SOCIAL HISTORY:  The patient is a nonsmoker.  Denies alcohol or illicit  drug abuse.   FAMILY HISTORY:  No first-degree relatives with breast, colon, ovarian,  or uterine carcinoma.   REVIEW OF SYSTEMS:  The 14-point comprehensive review of systems within  normal limits.   PHYSICAL EXAMINATION:  HEENT:  Grossly normal.  BREASTS:  Without masses, discharge, thickenings, or tenderness.  CHEST:  Clear to percussion and auscultation.  CARDIOVASCULAR:  Without murmurs or enlargements.  Regular rate and  rhythm.  Extremities, lymphatics, skin, neurological, and musculoskeletal systems  within normal limits.  ABDOMEN:  The patient has a full-term pregnancy without gross  hepatosplenomegaly as best it can be palpated.  Abdomen is soft.  PELVIC:  Deferred.   IMPRESSION:  The patient is 39-0/7 weeks' gestation and transient  hypertension with the  patient declining a vaginal birth and preferring  primary cesarean section.  The patient has had a thorough discussion  about the risks and benefits associated with both induction and cesarean  section.  She understands that with a cesarean section performed  electively, there is a somewhat higher chance of transient tachypnea of  newborn associated with this baby including possible postdelivery  respiratory issues.  She understands that she is at risk for incisional  infection and uterine atony resulting in heavy bleeding.  All questions  answered to the satisfaction of the patient, and therefore, she is  scheduled for September 13, 2007.      Ginger Carne, MD  Electronically Signed     SHB/MEDQ  D:  09/12/2007  T:  09/13/2007  Job:  [id number]

## 2010-06-18 ENCOUNTER — Other Ambulatory Visit: Payer: Self-pay | Admitting: Obstetrics & Gynecology

## 2010-06-18 ENCOUNTER — Ambulatory Visit (HOSPITAL_COMMUNITY)
Admission: RE | Admit: 2010-06-18 | Discharge: 2010-06-18 | Disposition: A | Discharge status: Home / Self Care | Payer: Private Health Insurance - Indemnity | Source: Ambulatory Visit | Attending: Obstetrics & Gynecology | Admitting: Obstetrics & Gynecology

## 2010-06-18 DIAGNOSIS — O358XX0 Maternal care for other (suspected) fetal abnormality and damage, not applicable or unspecified: Secondary | ICD-10-CM | POA: Insufficient documentation

## 2010-06-18 DIAGNOSIS — IMO0002 Reserved for concepts with insufficient information to code with codable children: Secondary | ICD-10-CM

## 2010-06-18 DIAGNOSIS — Z0489 Encounter for examination and observation for other specified reasons: Secondary | ICD-10-CM

## 2010-06-18 DIAGNOSIS — Z363 Encounter for antenatal screening for malformations: Secondary | ICD-10-CM | POA: Insufficient documentation

## 2010-06-18 DIAGNOSIS — Z1389 Encounter for screening for other disorder: Secondary | ICD-10-CM | POA: Insufficient documentation

## 2010-06-20 NOTE — Discharge Summary (Signed)
NAME:  April, Daniels NO.:  [id number]   MEDICAL RECORD NO.:  [id number]          PATIENT TYPE:  POB   LOCATION:  [facility]                          FACILITY:  [facility]   PHYSICIAN:  Ginger Carne, MD  DATE OF BIRTH:  06-Jul-1975   DATE OF ADMISSION:  09/13/2007  DATE OF DISCHARGE:  09/16/2007                               DISCHARGE SUMMARY   DISCHARGE MEDICATIONS:  Prescription for:  1. Micronor p.o. daily at the same time each day.  2. Ibuprofen 600 mg p.o. q.6 h. as needed for postpartum pain,      dispense 30, refills none.  3. Prenatal vitamins p.o. daily while breast-feeding, dispense 30,      refills p.r.n.  4. Colace 100 mg p.o. b.i.d. as needed for postpartum constipation,      dispense 30, refills none.   REASON FOR ADMISSION:  Cesarean section.   PROCEDURES:  Prenatal: NST, Ultrasound.  Intrapartum: low-tranverse cesarean section.  Postpartum: none.   COMPLICATIONS:  Operative and postpartum: none.   DISCHARGE DIAGNOSES:  1. Term pregnancy, delivered.  2. Transient hypertension of pregnancy.   DISCHARGE INFORMATION:   ACTIVITY:  Pelvic rest x6 weeks.  Increase activity slowly.   DIET:  Routine.   MEDICATIONS:  None.   STATUS:  Well.   INSTRUCTIONS:  Routine.  Discharged home to follow up in 6 weeks at  [facility]  The patient is to return to [facility] on the following  Tuesday for staple removal.   NEWBORN DATA:  The patient delivered a viable female, weight 6 pounds 8  ounces, newborn discharged home with mother, no complications.   BRIEF HOSPITAL COURSE:  This is a 35 year old G1, P0, at [redacted] weeks  gestation age with transient hypertension, who declined vaginal birth,  electing to do low-transverse cesarean section.  The patient delivered a  viable female with Apgars of 8 at 1 minute and 9 at 5 minutes; weight of  6 pounds and 8 ounces, 3-vessel cord placenta intact.  The patient was  discharged home on postop day #2, due to her  request.  There was no  complication.  The patient was hemodynamically stable for discharge.  The patient has chosen to take birth control pill, a prescription for  Micronor was given.  The patient is also going to be breast-feeding.  The patient encouraged to take prenatal  vitamin, while she is breast-feeding.  The patient has a followup exam  at [facility].  She also has an appointment for the following Tuesday  to have her staples removed.  She is GBS negative, rubella immune.  Blood type O positive.      Angeline Slim, MD  Electronically Signed     ______________________________  Ginger Carne, MD    CT/MEDQ  D:  10/03/2007  T:  10/04/2007  Job:  [id number]

## 2010-06-20 NOTE — Discharge Summary (Signed)
NAME:  April Daniels, April Daniels NO.:  [id number]   MEDICAL RECORD NO.:  [id number]          PATIENT TYPE:  INP   LOCATION:                                [facility]   PHYSICIAN:  Ginger Carne, MD  DATE OF BIRTH:  Dec 05, 1975   DATE OF ADMISSION:  09/13/2007  DATE OF DISCHARGE:  09/16/2007                               DISCHARGE SUMMARY   ADMISSION DIAGNOSIS:  Onset of labor.   DISCHARGE MEDICATIONS:  1. Ibuprofen 600 mg p.o. q.6 h. p.r.n. pain dispensed 30, no refills.  2. Prenatal vitamin take 1 p.o. daily dispensed 30, refills as needed.  3. Micronor birth control p.o. daily at the same time, dispensed 28,      refills 3.  4. Colace 100 mg p.o. b.i.d. as needed for postpartum constipation,      dispensed 30, refills none.   HOSPITAL COURSE:  This is a 35 year old G1, P0 at [redacted] weeks gestation age  who presented with transient hypertension, who declined vaginal birth,  was admitted for elective primary low-tranverse C-section.  The patient  was taken to the OR, delivered viable female with Apgars 8 and 9, weight  of 6 pounds 8 ounces.  Three-vessel cord intact.  The patient was  discharged home on postoperative day #2 without complications.  Vitals  were stable.  For contraception, the patient had chosen to take  Micronor, progesterone pill.  Prescription was given on discharge.  The  patient will follow up at [facility].  She has an appointment on  Tuesday for staples removal.  Blood type is O+, antibody negative,  rubella immune, hepatitis B negative, GBS negative, HIV nonreactive, and  GC Chlamydia negative, 1-hour Glucola was 111.  On September 13, 2007,  hemoglobin was 12.5 and hematocrit 36.3.   POSTPARTUM PROCEDURES:  None.   COMPLICATIONS:  Periodic postpartum none.   DISCHARGE DIAGNOSIS:  Term pregnancy, delivered.   DISCHARGE INFORMATION:  Discharge date, September 16, 2007.   ACTIVITY:  Pelvic rest x6 weeks.  Increase activity slowly.   DIET:   Routine.   MEDICATIONS:  As listed above.   Status is well.  Instructions routine.  Discharged home to follow up at  [facility].  The patient already has an appointment.   NEWBORN DATA:  Female patient weighing 6 pounds 8 ounces, 18.75 inches  in length.  Infant discharged home to mother.      Angeline Slim, MD  Electronically Signed     ______________________________  Ginger Carne, MD    CT/MEDQ  D:  09/28/2007  T:  09/29/2007  Job:  [id number]

## 2010-07-14 ENCOUNTER — Ambulatory Visit (HOSPITAL_COMMUNITY)
Admission: RE | Admit: 2010-07-14 | Discharge: 2010-07-14 | Disposition: A | Discharge status: Home / Self Care | Payer: Managed Care, Other (non HMO) | Source: Ambulatory Visit | Attending: Obstetrics & Gynecology | Admitting: Obstetrics & Gynecology

## 2010-07-14 DIAGNOSIS — Z0489 Encounter for examination and observation for other specified reasons: Secondary | ICD-10-CM

## 2010-07-14 DIAGNOSIS — O34219 Maternal care for unspecified type scar from previous cesarean delivery: Secondary | ICD-10-CM | POA: Insufficient documentation

## 2010-07-14 DIAGNOSIS — Z3689 Encounter for other specified antenatal screening: Secondary | ICD-10-CM | POA: Insufficient documentation

## 2010-07-14 DIAGNOSIS — IMO0002 Reserved for concepts with insufficient information to code with codable children: Secondary | ICD-10-CM

## 2010-07-31 ENCOUNTER — Encounter (INDEPENDENT_AMBULATORY_CARE_PROVIDER_SITE_OTHER): Payer: Private Health Insurance - Indemnity | Admitting: Obstetrics & Gynecology

## 2010-07-31 DIAGNOSIS — Z3480 Encounter for supervision of other normal pregnancy, unspecified trimester: Secondary | ICD-10-CM

## 2010-07-31 DIAGNOSIS — IMO0002 Reserved for concepts with insufficient information to code with codable children: Secondary | ICD-10-CM

## 2010-07-31 DIAGNOSIS — O34219 Maternal care for unspecified type scar from previous cesarean delivery: Secondary | ICD-10-CM

## 2010-08-14 ENCOUNTER — Encounter: Payer: Self-pay | Admitting: Obstetrics & Gynecology

## 2010-08-14 ENCOUNTER — Other Ambulatory Visit: Payer: Self-pay | Admitting: Gynecology

## 2010-08-14 ENCOUNTER — Other Ambulatory Visit: Payer: Self-pay | Admitting: Obstetrics & Gynecology

## 2010-08-14 ENCOUNTER — Ambulatory Visit (INDEPENDENT_AMBULATORY_CARE_PROVIDER_SITE_OTHER): Payer: Private Health Insurance - Indemnity | Admitting: Obstetrics & Gynecology

## 2010-08-14 VITALS — BP 120/72 | Wt 206.0 lb

## 2010-08-14 DIAGNOSIS — Z3480 Encounter for supervision of other normal pregnancy, unspecified trimester: Secondary | ICD-10-CM

## 2010-08-14 DIAGNOSIS — O34219 Maternal care for unspecified type scar from previous cesarean delivery: Secondary | ICD-10-CM | POA: Insufficient documentation

## 2010-08-14 MED ORDER — ESOMEPRAZOLE MAGNESIUM 20 MG PO CPDR: 20.0000 mg | DELAYED_RELEASE_CAPSULE | Freq: Every day | ORAL | Status: DC

## 2010-08-14 NOTE — Progress Notes (Signed)
Complained of GERD not responsive to Prilosec.  Nexium Rx given.  FM/PTL precautions advised.

## 2010-08-14 NOTE — Patient Instructions (Addendum)
False Labor (Braxton Hicks Contractions) Pregnancy is commonly associated with contractions of the uterus throughout the pregnancy. Towards the end of pregnancy (32-34 weeks), these contractions [facility] W[facility] Rough) can develop more often and may become more forceful. This is not true labor because these contractions do not result in opening (dilatation) and thinning of the cervix. They are sometimes difficult to tell apart from true labor because these contractions can be forceful and people have different pain tolerances. You should not feel embarrassed if you go to the hospital with false labor. Sometimes, the only way to tell if you are in true labor is for your caregiver to follow the changes in the cervix. HOW TO TELL THE DIFFERENCE BETWEEN TRUE AND FALSE LABOR  False labor.   The contractions of false labor are usually shorter, irregular and not as hard as those of true labor.   They are often felt in the front of the lower abdomen and in the groin.   They may leave with walking around or changing positions while lying down.   They get weaker and are shorter lasting as time goes on.   These contractions are usually irregular.   They do not usually become progressively stronger, regular and closer together as with true labor.   True labor.   Contractions in true labor last 30 to 70 seconds, become very regular, usually become more intense, and increase in frequency.   They do not go away with walking.   The discomfort is usually felt in the top of the uterus and spreads to the lower abdomen and low back.   True labor can be determined by your caregiver with an exam. This will show that the cervix is dilating and getting thinner.  If there are no prenatal problems or other health problems associated with the pregnancy, it is completely safe to be sent home with false labor and await the onset of true labor. HOME CARE INSTRUCTIONS  Keep up with your usual exercises and instructions.    Take medications as directed.   Keep your regular prenatal appointment.   Eat and drink lightly if you think you are going into labor.   If BH contractions are making you uncomfortable:   Change your activity position from lying down or resting to walking/walking to resting.   Sit and rest in a tub of warm water.   Drink 2 to 3 glasses of water. Dehydration may cause B-H contractions.   Do slow and deep breathing several times an hour.  SEEK IMMEDIATE MEDICAL CARE IF:  Your contractions continue to become stronger, more regular, and closer together.   You have a gushing, burst or leaking of fluid from the vagina.   An oral temperature above 101F develops.   You have passage of blood-tinged mucus.   You develop vaginal bleeding.   You develop continuous belly (abdominal) pain.   You have low back pain that you never had before.   You feel the baby's head pushing down causing pelvic pressure.   The baby is not moving as much as it used to.  Document Released: 01/19/2005 Document Re-Released: 07/09/2009 [facility] Patient Information 2011 [place], [place].  Kick Count Fetal Movement Counts Kick counts is highly recommended in high risk pregnancies, but it is a good idea for every pregnant woman to do. Start counting fetal movements at 28 weeks of the pregnancy. Fetal movements increase after eating a full meal or eating or drinking something sweet (the blood sugar is higher). It is  also important to drink plenty of fluids (well hydrated) before doing the count. Lie on your left side because it helps with the circulation or you can sit in a comfortable chair with your arms over your belly (abdomen) with no distractions around you. DOING THE COUNT:  Try to do the count the same time of day each time you do it.   Mark the day and time, then see how long it takes for you to feel 10 movements (kicks, flutters, swishes, rolls). You should have at least 10 movements within 2 hours.  You will most likely feel 10 movements in much less than 2 hours. If you do not, wait an hour and count again. After a couple of days you will see a pattern.   What you are looking for is a change in the pattern or not enough counts in 2 hours. Is it taking longer in time to reach 10 movements?  SEEK MEDICAL CARE IF:  You feel less than 10 counts in 2 hours. Tried twice.   No movement in one hour.   The pattern is changing or taking longer each day to reach 10 counts in 2 hours.   You feel the baby is not moving as it usually does. Document Released: 02/18/2006 Document Re-Released: 07/09/2009 [facility] Patient Information 2011 [place], [place].

## 2010-08-15 LAB — GLUCOSE TOLERANCE, 1 HOUR: Glucose, 1 Hour GTT: 113 mg/dL (ref 70–140)

## 2010-08-15 LAB — CBC
HCT: 35.1 % — ABNORMAL LOW (ref 36.0–46.0)
Hemoglobin: 11.6 g/dL — ABNORMAL LOW (ref 12.0–15.0)
MCH: 30.4 pg (ref 26.0–34.0)
MCHC: 33 g/dL (ref 30.0–36.0)
MCV: 92.1 fL (ref 78.0–100.0)
Platelets: 220 10*3/uL (ref 150–400)
RBC: 3.81 MIL/uL — ABNORMAL LOW (ref 3.87–5.11)
RDW: 13.7 % (ref 11.5–15.5)
WBC: 5.1 10*3/uL (ref 4.0–10.5)

## 2010-08-15 LAB — RPR

## 2010-08-15 LAB — HIV ANTIBODY (ROUTINE TESTING W REFLEX): HIV: NONREACTIVE

## 2010-08-27 ENCOUNTER — Encounter (INDEPENDENT_AMBULATORY_CARE_PROVIDER_SITE_OTHER): Payer: Private Health Insurance - Indemnity | Admitting: Obstetrics & Gynecology

## 2010-08-27 DIAGNOSIS — Z3480 Encounter for supervision of other normal pregnancy, unspecified trimester: Secondary | ICD-10-CM

## 2010-09-17 ENCOUNTER — Encounter (INDEPENDENT_AMBULATORY_CARE_PROVIDER_SITE_OTHER): Payer: Private Health Insurance - Indemnity | Admitting: Obstetrics & Gynecology

## 2010-09-17 DIAGNOSIS — IMO0002 Reserved for concepts with insufficient information to code with codable children: Secondary | ICD-10-CM

## 2010-09-17 DIAGNOSIS — Z3480 Encounter for supervision of other normal pregnancy, unspecified trimester: Secondary | ICD-10-CM

## 2010-09-17 DIAGNOSIS — O34219 Maternal care for unspecified type scar from previous cesarean delivery: Secondary | ICD-10-CM

## 2010-09-18 ENCOUNTER — Encounter: Payer: Private Health Insurance - Indemnity | Admitting: Obstetrics & Gynecology

## 2010-09-30 ENCOUNTER — Inpatient Hospital Stay (HOSPITAL_COMMUNITY)
Admission: AD | Admit: 2010-09-30 | Discharge: 2010-10-01 | Disposition: A | Discharge status: Home / Self Care | Payer: Managed Care, Other (non HMO) | Source: Ambulatory Visit | Attending: Family Medicine | Admitting: Family Medicine

## 2010-09-30 ENCOUNTER — Encounter (HOSPITAL_COMMUNITY): Payer: Self-pay | Admitting: *Deleted

## 2010-09-30 ENCOUNTER — Inpatient Hospital Stay (HOSPITAL_COMMUNITY): Payer: Managed Care, Other (non HMO)

## 2010-09-30 DIAGNOSIS — N289 Disorder of kidney and ureter, unspecified: Secondary | ICD-10-CM | POA: Insufficient documentation

## 2010-09-30 DIAGNOSIS — O9989 Other specified diseases and conditions complicating pregnancy, childbirth and the puerperium: Secondary | ICD-10-CM | POA: Insufficient documentation

## 2010-09-30 DIAGNOSIS — O212 Late vomiting of pregnancy: Secondary | ICD-10-CM | POA: Insufficient documentation

## 2010-09-30 HISTORY — DX: Unspecified asthma, uncomplicated: J45.909

## 2010-09-30 LAB — URINALYSIS, ROUTINE W REFLEX MICROSCOPIC
Bilirubin Urine: NEGATIVE
Glucose, UA: NEGATIVE mg/dL
Ketones, ur: 15 mg/dL — AB
Leukocytes, UA: NEGATIVE
Nitrite: NEGATIVE
Protein, ur: 30 mg/dL — AB
Specific Gravity, Urine: >1.03 — ABNORMAL HIGH (ref 1.005–1.030)
Urobilinogen, UA: 0.2 mg/dL (ref 0.0–1.0)
pH: 6 (ref 5.0–8.0)

## 2010-09-30 LAB — URINE MICROSCOPIC-ADD ON

## 2010-09-30 LAB — COMPREHENSIVE METABOLIC PANEL
ALT: 14 U/L (ref 0–35)
AST: 22 U/L (ref 0–37)
Albumin: 2.7 g/dL — ABNORMAL LOW (ref 3.5–5.2)
Alkaline Phosphatase: 122 U/L — ABNORMAL HIGH (ref 39–117)
BUN: 7 mg/dL (ref 6–23)
CO2: 17 mEq/L — ABNORMAL LOW (ref 19–32)
Calcium: 8.6 mg/dL (ref 8.4–10.5)
Chloride: 105 mEq/L (ref 96–112)
Creatinine, Ser: 0.67 mg/dL (ref 0.50–1.10)
GFR calc Af Amer: >60 mL/min (ref >60)
GFR calc non Af Amer: >60 mL/min (ref >60)
Glucose, Bld: 83 mg/dL (ref 70–99)
Potassium: 3.6 mEq/L (ref 3.5–5.1)
Sodium: 135 mEq/L (ref 135–145)
Total Bilirubin: 0.3 mg/dL (ref 0.3–1.2)
Total Protein: 5.7 g/dL — ABNORMAL LOW (ref 6.0–8.3)

## 2010-09-30 MED ORDER — PROMETHAZINE HCL 25 MG/ML IJ SOLN
25.0000 mg | INTRAVENOUS | Status: DC
  Administered 2010-09-30: 25 mg via INTRAVENOUS
  Filled 2010-09-30: qty 1

## 2010-09-30 MED ORDER — BUTORPHANOL TARTRATE 2 MG/ML IJ SOLN
1.0000 mg | Freq: Once | INTRAMUSCULAR | Status: AC
  Administered 2010-09-30: 1 mg via INTRAVENOUS
  Filled 2010-09-30: qty 1

## 2010-09-30 MED ORDER — ACETAMINOPHEN 325 MG PO TABS
650.0000 mg | ORAL_TABLET | Freq: Once | ORAL | Status: AC
  Administered 2010-09-30: 650 mg via ORAL

## 2010-09-30 NOTE — Progress Notes (Signed)
OK to dc efm per CNM.  

## 2010-09-30 NOTE — ED Provider Notes (Signed)
MAU progress note  Subjective: Patient states doing better with the phenergan.  Does still have some nausea when up and moving around.  Pain is located in the right lower quandrant, no fever or chills.  Started this afternoon around 4pm shortly after n/v began.  Pain is dull and crampy in characteristic.  Feeling well enough to go home.  Would like something stronger than tylenol for pain.  Please see initial evaluation note by Sharen Counter.  Objective Filed Vitals:   09/30/10 1912  BP: 126/78  Temp: 97.2 F (36.2 C)  Resp: 20   UA notable for 15 ketones, large blood, Ca oxalate crystals, SP of >1.030 Renal US - Mild right pelvicaliectasis is nonspecific in the setting of  Pregnancy.  Assessment and Plan 35 year old G2P1 at [redacted]w[redacted]d presenting with RLQ pain and n/v.  Etiology unclear but possibilities include nephrolithiasis, viral illness, pregnancy related pain.  Improved with IVFs and phenergan. -will give phenergan -will give Percocet 5-325 for pain control -will have her follow up with OB this week. -discharge home.

## 2010-09-30 NOTE — Progress Notes (Signed)
Report given. Notified of pt here for n/v, diarrhea and cramping.  Will be up to see pt.

## 2010-09-30 NOTE — Progress Notes (Signed)
Pt states, " I woke up at 3 am with indigestions and diarrhea, and ended going to the bathroom 4-5 times up till 9 am, this it stopped. At 4:30 pm this afternoon, cramping started. I've been nauseated all day but kept food down.On the way here I threw up one time and then again in the lobby. I felt real hot like I was going to pass out."

## 2010-09-30 NOTE — Progress Notes (Signed)
Active fetal movement heard on [place]

## 2010-09-30 NOTE — Progress Notes (Signed)
Pt sitting directly up in bed, nauseated.

## 2010-09-30 NOTE — Progress Notes (Signed)
Misty Stanley, CNM student at bedside.  Assessment done.  poc discussed with pt.

## 2010-10-01 ENCOUNTER — Encounter: Payer: Managed Care, Other (non HMO) | Admitting: Obstetrics & Gynecology

## 2010-10-01 ENCOUNTER — Encounter (HOSPITAL_COMMUNITY): Payer: Self-pay | Admitting: *Deleted

## 2010-10-01 MED ORDER — PROMETHAZINE HCL 25 MG PO TABS: 25.0000 mg | ORAL_TABLET | Freq: Four times a day (QID) | ORAL | Status: AC | PRN

## 2010-10-01 MED ORDER — OXYCODONE-ACETAMINOPHEN 5-325 MG PO TABS: 1.0000 | ORAL_TABLET | ORAL | Status: AC | PRN

## 2010-10-02 ENCOUNTER — Encounter (INDEPENDENT_AMBULATORY_CARE_PROVIDER_SITE_OTHER): Payer: Managed Care, Other (non HMO) | Admitting: Obstetrics & Gynecology

## 2010-10-02 DIAGNOSIS — Z3480 Encounter for supervision of other normal pregnancy, unspecified trimester: Secondary | ICD-10-CM

## 2010-10-08 ENCOUNTER — Other Ambulatory Visit: Payer: Self-pay | Admitting: Obstetrics and Gynecology

## 2010-10-08 ENCOUNTER — Ambulatory Visit (INDEPENDENT_AMBULATORY_CARE_PROVIDER_SITE_OTHER): Payer: Managed Care, Other (non HMO) | Admitting: Obstetrics and Gynecology

## 2010-10-08 DIAGNOSIS — Z3480 Encounter for supervision of other normal pregnancy, unspecified trimester: Secondary | ICD-10-CM

## 2010-10-08 MED ORDER — ESOMEPRAZOLE MAGNESIUM 20 MG PO PACK: 20.0000 mg | PACK | Freq: Every day | ORAL | Status: DC

## 2010-10-13 ENCOUNTER — Ambulatory Visit (HOSPITAL_COMMUNITY)
Admission: RE | Admit: 2010-10-13 | Discharge: 2010-10-13 | Disposition: A | Discharge status: Home / Self Care | Payer: Managed Care, Other (non HMO) | Source: Ambulatory Visit | Attending: Obstetrics & Gynecology | Admitting: Obstetrics & Gynecology

## 2010-10-13 ENCOUNTER — Other Ambulatory Visit: Payer: Self-pay | Admitting: Obstetrics & Gynecology

## 2010-10-13 ENCOUNTER — Ambulatory Visit (HOSPITAL_COMMUNITY)
Admission: RE | Admit: 2010-10-13 | Discharge: 2010-10-13 | Disposition: A | Discharge status: Home / Self Care | Payer: Managed Care, Other (non HMO) | Source: Ambulatory Visit | Attending: Obstetrics and Gynecology | Admitting: Obstetrics and Gynecology

## 2010-10-13 DIAGNOSIS — O3662X0 Maternal care for excessive fetal growth, second trimester, not applicable or unspecified: Secondary | ICD-10-CM

## 2010-10-13 DIAGNOSIS — O3660X0 Maternal care for excessive fetal growth, unspecified trimester, not applicable or unspecified: Secondary | ICD-10-CM | POA: Insufficient documentation

## 2010-10-13 DIAGNOSIS — Z3689 Encounter for other specified antenatal screening: Secondary | ICD-10-CM | POA: Insufficient documentation

## 2010-10-13 NOTE — Progress Notes (Signed)
See OB US report in [facility]

## 2010-10-21 ENCOUNTER — Ambulatory Visit (INDEPENDENT_AMBULATORY_CARE_PROVIDER_SITE_OTHER): Payer: Managed Care, Other (non HMO) | Admitting: Obstetrics & Gynecology

## 2010-10-21 DIAGNOSIS — Z3480 Encounter for supervision of other normal pregnancy, unspecified trimester: Secondary | ICD-10-CM

## 2010-10-23 LAB — GC/CHLAMYDIA PROBE AMP, GENITAL
Chlamydia, DNA Probe: NEGATIVE
GC Probe Amp, Genital: NEGATIVE

## 2010-10-24 LAB — STREP B DNA PROBE: GBSP: NEGATIVE

## 2010-10-28 LAB — FETAL FIBRONECTIN: Fetal Fibronectin: NEGATIVE

## 2010-10-29 ENCOUNTER — Encounter (INDEPENDENT_AMBULATORY_CARE_PROVIDER_SITE_OTHER): Payer: Managed Care, Other (non HMO) | Admitting: Obstetrics and Gynecology

## 2010-10-29 DIAGNOSIS — O34219 Maternal care for unspecified type scar from previous cesarean delivery: Secondary | ICD-10-CM

## 2010-10-29 DIAGNOSIS — Z3480 Encounter for supervision of other normal pregnancy, unspecified trimester: Secondary | ICD-10-CM

## 2010-10-30 ENCOUNTER — Encounter (HOSPITAL_COMMUNITY): Payer: Self-pay

## 2010-10-30 NOTE — Patient Instructions (Addendum)
   Your procedure is scheduled on: thursday  Enter through the Main Entrance of [facility] at: 7:30am Pick up the phone at the desk and dial [phone] and inform us of your arrival  Please call this number if you have any problems the morning of surgery: [phone]  Remember: Do not eat food after midnight  Do not drink clear liquids after:midnight Take these medicines the morning of surgery with a SIP OF WATER: zantac, bring your inhaler  Do not wear jewelry, make-up, or FINGER nail polish Do not wear lotions, powders, or perfumes.  You may wear deodorant. Do not shave 48 hours prior to surgery. Do not bring valuables to the hospital. Leave suitcase in the car. After Surgery it may be brought to your room. For patients being admitted to the hospital, checkout time is 11:00am the day of discharge.    Remember to use your hibiclens as instructed.Please shower with 1/2 bottle the evening before your surgery and the other 1/2 bottle the morning of surgery.

## 2010-10-31 ENCOUNTER — Encounter (HOSPITAL_COMMUNITY)
Admission: RE | Admit: 2010-10-31 | Discharge: 2010-10-31 | Payer: Managed Care, Other (non HMO) | Source: Ambulatory Visit | Attending: Obstetrics & Gynecology | Admitting: Obstetrics & Gynecology

## 2010-10-31 ENCOUNTER — Encounter (HOSPITAL_COMMUNITY): Payer: Self-pay

## 2010-10-31 DIAGNOSIS — O34219 Maternal care for unspecified type scar from previous cesarean delivery: Secondary | ICD-10-CM

## 2010-10-31 HISTORY — DX: Gastro-esophageal reflux disease without esophagitis: K21.9

## 2010-10-31 LAB — BASIC METABOLIC PANEL
BUN: 10
CO2: 20
Calcium: 8.8
Chloride: 109
Creatinine, Ser: 0.78
GFR calc Af Amer: >60
GFR calc non Af Amer: >60
Glucose, Bld: 117 — ABNORMAL HIGH
Potassium: 3.7
Sodium: 134 — ABNORMAL LOW

## 2010-10-31 LAB — URINALYSIS, DIPSTICK ONLY
Bilirubin Urine: NEGATIVE
Glucose, UA: 100 — AB
Ketones, ur: NEGATIVE
Nitrite: NEGATIVE
Protein, ur: NEGATIVE
Specific Gravity, Urine: <1.005 — ABNORMAL LOW
Urobilinogen, UA: 0.2
pH: 6.5

## 2010-10-31 LAB — CBC
HCT: 35.4 % — ABNORMAL LOW (ref 36.0–46.0)
HCT: 36.2
HCT: 24.2 — ABNORMAL LOW
HCT: 36.3
Hemoglobin: 12.1 g/dL (ref 12.0–15.0)
Hemoglobin: 12.3
Hemoglobin: 12.5
Hemoglobin: 8.4 — ABNORMAL LOW
MCH: 30.2 pg (ref 26.0–34.0)
MCHC: 34.2 g/dL (ref 30.0–36.0)
MCHC: 33.9
MCHC: 34.5
MCHC: 34.8
MCV: 88.3 fL (ref 78.0–100.0)
MCV: 92.9
MCV: 92.6
MCV: 93.9
Platelets: 183 10*3/uL (ref 150–400)
Platelets: 214
Platelets: 180
Platelets: 207
RBC: 4.01 MIL/uL (ref 3.87–5.11)
RBC: 3.9
RBC: 2.58 — ABNORMAL LOW
RBC: 3.92
RDW: 13 % (ref 11.5–15.5)
RDW: 13.2
RDW: 13.2
RDW: 13.9
WBC: 8 10*3/uL (ref 4.0–10.5)
WBC: 11.6 — ABNORMAL HIGH
WBC: 10.3
WBC: 9.1

## 2010-10-31 LAB — COMPREHENSIVE METABOLIC PANEL
ALT: 16
AST: 21
Albumin: 2.5 — ABNORMAL LOW
Alkaline Phosphatase: 142 — ABNORMAL HIGH
BUN: 5 — ABNORMAL LOW
CO2: 23
Calcium: 8.9
Chloride: 107
Creatinine, Ser: 0.62
GFR calc Af Amer: >60
GFR calc non Af Amer: >60
Glucose, Bld: 102 — ABNORMAL HIGH
Potassium: 3.7
Sodium: 137
Total Bilirubin: 0.3
Total Protein: 5.7 — ABNORMAL LOW

## 2010-10-31 LAB — SURGICAL PCR SCREEN
MRSA, PCR: NEGATIVE
Staphylococcus aureus: NEGATIVE

## 2010-10-31 LAB — TYPE AND SCREEN
ABO/RH(D): O POS
Antibody Screen: NEGATIVE

## 2010-10-31 LAB — ABO/RH: ABO/RH(D): O POS

## 2010-10-31 LAB — RPR
RPR Ser Ql: NONREACTIVE
RPR Ser Ql: NONREACTIVE

## 2010-10-31 LAB — URIC ACID: Uric Acid, Serum: 5

## 2010-11-05 ENCOUNTER — Encounter (INDEPENDENT_AMBULATORY_CARE_PROVIDER_SITE_OTHER): Payer: Managed Care, Other (non HMO) | Admitting: Obstetrics and Gynecology

## 2010-11-05 DIAGNOSIS — Z3480 Encounter for supervision of other normal pregnancy, unspecified trimester: Secondary | ICD-10-CM

## 2010-11-05 DIAGNOSIS — IMO0002 Reserved for concepts with insufficient information to code with codable children: Secondary | ICD-10-CM

## 2010-11-05 DIAGNOSIS — O34219 Maternal care for unspecified type scar from previous cesarean delivery: Secondary | ICD-10-CM

## 2010-11-06 ENCOUNTER — Encounter (HOSPITAL_COMMUNITY): Payer: Self-pay | Admitting: *Deleted

## 2010-11-06 ENCOUNTER — Inpatient Hospital Stay (HOSPITAL_COMMUNITY): Payer: Managed Care, Other (non HMO) | Admitting: Anesthesiology

## 2010-11-06 ENCOUNTER — Inpatient Hospital Stay (HOSPITAL_COMMUNITY)
Admission: RE | Admit: 2010-11-06 | Discharge: 2010-11-08 | DRG: 766 | Disposition: A | Discharge status: Home / Self Care | Payer: Managed Care, Other (non HMO) | Source: Ambulatory Visit | Attending: Obstetrics & Gynecology | Admitting: Obstetrics & Gynecology

## 2010-11-06 ENCOUNTER — Encounter (HOSPITAL_COMMUNITY)
Admission: RE | Disposition: A | Discharge status: Home / Self Care | Payer: Self-pay | Source: Ambulatory Visit | Attending: Obstetrics & Gynecology

## 2010-11-06 ENCOUNTER — Encounter (HOSPITAL_COMMUNITY): Payer: Self-pay | Admitting: Emergency Medicine

## 2010-11-06 ENCOUNTER — Encounter (HOSPITAL_COMMUNITY): Payer: Self-pay | Admitting: Anesthesiology

## 2010-11-06 DIAGNOSIS — Z3480 Encounter for supervision of other normal pregnancy, unspecified trimester: Secondary | ICD-10-CM

## 2010-11-06 DIAGNOSIS — O34219 Maternal care for unspecified type scar from previous cesarean delivery: Secondary | ICD-10-CM

## 2010-11-06 DIAGNOSIS — Z01812 Encounter for preprocedural laboratory examination: Secondary | ICD-10-CM

## 2010-11-06 DIAGNOSIS — Z01818 Encounter for other preprocedural examination: Secondary | ICD-10-CM

## 2010-11-06 LAB — CBC
HCT: 34.2 % — ABNORMAL LOW (ref 36.0–46.0)
Hemoglobin: 11.5 g/dL — ABNORMAL LOW (ref 12.0–15.0)
MCH: 30 pg (ref 26.0–34.0)
MCHC: 33.6 g/dL (ref 30.0–36.0)
MCV: 89.3 fL (ref 78.0–100.0)
Platelets: 174 10*3/uL (ref 150–400)
RBC: 3.83 MIL/uL — ABNORMAL LOW (ref 3.87–5.11)
RDW: 13.4 % (ref 11.5–15.5)
WBC: 8.4 10*3/uL (ref 4.0–10.5)

## 2010-11-06 SURGERY — Surgical Case
Anesthesia: Spinal | Site: Abdomen | Wound class: Clean Contaminated

## 2010-11-06 MED ORDER — GLYCOPYRROLATE 0.2 MG/ML IJ SOLN
INTRAMUSCULAR | Status: DC | PRN
  Administered 2010-11-06: 0.2 mg via INTRAVENOUS

## 2010-11-06 MED ORDER — SENNOSIDES-DOCUSATE SODIUM 8.6-50 MG PO TABS
2.0000 | ORAL_TABLET | Freq: Every day | ORAL | Status: DC
  Administered 2010-11-06 – 2010-11-07 (×2): 2 via ORAL

## 2010-11-06 MED ORDER — TETANUS-DIPHTH-ACELL PERTUSSIS 5-2.5-18.5 LF-MCG/0.5 IM SUSP
0.5000 mL | Freq: Once | INTRAMUSCULAR | Status: AC
  Administered 2010-11-07: 0.5 mL via INTRAMUSCULAR
  Filled 2010-11-06: qty 0.5

## 2010-11-06 MED ORDER — LORATADINE 10 MG PO TABS
10.0000 mg | ORAL_TABLET | Freq: Every day | ORAL | Status: DC
  Administered 2010-11-07 – 2010-11-08 (×2): 10 mg via ORAL
  Filled 2010-11-06 (×4): qty 1

## 2010-11-06 MED ORDER — ONDANSETRON HCL 4 MG PO TABS: 4.0000 mg | ORAL_TABLET | ORAL | Status: DC | PRN

## 2010-11-06 MED ORDER — SODIUM CHLORIDE 0.9 % IV SOLN
1.0000 ug/kg/h | INTRAVENOUS | Status: DC | PRN
  Filled 2010-11-06: qty 2.5

## 2010-11-06 MED ORDER — KETOROLAC TROMETHAMINE 30 MG/ML IJ SOLN: 30.0000 mg | Freq: Four times a day (QID) | INTRAMUSCULAR | Status: AC | PRN

## 2010-11-06 MED ORDER — BUPIVACAINE HCL (PF) 0.5 % IJ SOLN
INTRAMUSCULAR | Status: DC | PRN
  Administered 2010-11-06: 30 mL

## 2010-11-06 MED ORDER — PANTOPRAZOLE SODIUM 40 MG PO TBEC
40.0000 mg | DELAYED_RELEASE_TABLET | Freq: Every day | ORAL | Status: DC
  Administered 2010-11-07 – 2010-11-08 (×2): 40 mg via ORAL
  Filled 2010-11-06 (×4): qty 1

## 2010-11-06 MED ORDER — SCOPOLAMINE 1 MG/3DAYS TD PT72
1.0000 | MEDICATED_PATCH | Freq: Once | TRANSDERMAL | Status: DC
  Filled 2010-11-06: qty 1

## 2010-11-06 MED ORDER — ONDANSETRON HCL 4 MG/2ML IJ SOLN: 4.0000 mg | INTRAMUSCULAR | Status: DC | PRN

## 2010-11-06 MED ORDER — NALOXONE HCL 0.4 MG/ML IJ SOLN: 0.4000 mg | INTRAMUSCULAR | Status: DC | PRN

## 2010-11-06 MED ORDER — NALBUPHINE HCL 10 MG/ML IJ SOLN
5.0000 mg | INTRAMUSCULAR | Status: DC | PRN
  Filled 2010-11-06: qty 1

## 2010-11-06 MED ORDER — NON FORMULARY: 1.0000 | Freq: Every day | Status: DC

## 2010-11-06 MED ORDER — SIMETHICONE 80 MG PO CHEW
80.0000 mg | CHEWABLE_TABLET | Freq: Three times a day (TID) | ORAL | Status: DC
  Administered 2010-11-06 – 2010-11-08 (×7): 80 mg via ORAL

## 2010-11-06 MED ORDER — SIMETHICONE 80 MG PO CHEW: 80.0000 mg | CHEWABLE_TABLET | ORAL | Status: DC | PRN

## 2010-11-06 MED ORDER — INFLUENZA VIRUS VACC SPLIT PF IM SUSP
0.5000 mL | Freq: Once | INTRAMUSCULAR | Status: AC
  Administered 2010-11-08: 0.5 mL via INTRAMUSCULAR
  Filled 2010-11-06: qty 0.5

## 2010-11-06 MED ORDER — SODIUM CHLORIDE 0.9 % IJ SOLN: 3.0000 mL | INTRAMUSCULAR | Status: DC | PRN

## 2010-11-06 MED ORDER — MENTHOL 3 MG MT LOZG: 1.0000 | LOZENGE | OROMUCOSAL | Status: DC | PRN

## 2010-11-06 MED ORDER — METOCLOPRAMIDE HCL 5 MG/ML IJ SOLN: 10.0000 mg | Freq: Three times a day (TID) | INTRAMUSCULAR | Status: DC | PRN

## 2010-11-06 MED ORDER — ZOLPIDEM TARTRATE 5 MG PO TABS: 5.0000 mg | ORAL_TABLET | Freq: Every evening | ORAL | Status: DC | PRN

## 2010-11-06 MED ORDER — LANOLIN HYDROUS EX OINT: 1.0000 "application " | TOPICAL_OINTMENT | CUTANEOUS | Status: DC | PRN

## 2010-11-06 MED ORDER — MEPERIDINE HCL 25 MG/ML IJ SOLN: 6.2500 mg | INTRAMUSCULAR | Status: DC | PRN

## 2010-11-06 MED ORDER — PRENATAL PLUS 27-1 MG PO TABS
1.0000 | ORAL_TABLET | Freq: Every day | ORAL | Status: DC
  Administered 2010-11-07 – 2010-11-08 (×2): 1 via ORAL
  Filled 2010-11-06 (×2): qty 1

## 2010-11-06 MED ORDER — DIPHENHYDRAMINE HCL 25 MG PO CAPS: 25.0000 mg | ORAL_CAPSULE | Freq: Four times a day (QID) | ORAL | Status: DC | PRN

## 2010-11-06 MED ORDER — WITCH HAZEL-GLYCERIN EX PADS: 1.0000 "application " | MEDICATED_PAD | CUTANEOUS | Status: DC | PRN

## 2010-11-06 MED ORDER — ONDANSETRON HCL 4 MG/2ML IJ SOLN
INTRAMUSCULAR | Status: DC | PRN
  Administered 2010-11-06: 4 mg via INTRAVENOUS

## 2010-11-06 MED ORDER — DIPHENHYDRAMINE HCL 50 MG/ML IJ SOLN: 12.5000 mg | INTRAMUSCULAR | Status: DC | PRN

## 2010-11-06 MED ORDER — OXYCODONE-ACETAMINOPHEN 5-325 MG PO TABS
1.0000 | ORAL_TABLET | ORAL | Status: DC | PRN
  Administered 2010-11-07: 1 via ORAL
  Administered 2010-11-07: 2 via ORAL
  Filled 2010-11-06: qty 2
  Filled 2010-11-06: qty 1

## 2010-11-06 MED ORDER — EPHEDRINE SULFATE 50 MG/ML IJ SOLN
INTRAMUSCULAR | Status: DC | PRN
  Administered 2010-11-06 (×2): 10 mg via INTRAVENOUS

## 2010-11-06 MED ORDER — OXYTOCIN 20 UNITS IN LACTATED RINGERS INFUSION - SIMPLE
125.0000 mL/h | INTRAVENOUS | Status: AC
  Administered 2010-11-06: 12:00:00 via INTRAVENOUS

## 2010-11-06 MED ORDER — LACTATED RINGERS IV SOLN
INTRAVENOUS | Status: DC
  Administered 2010-11-06: 18:00:00 via INTRAVENOUS

## 2010-11-06 MED ORDER — KETOROLAC TROMETHAMINE 60 MG/2ML IM SOLN
60.0000 mg | Freq: Once | INTRAMUSCULAR | Status: AC | PRN
  Administered 2010-11-06: 60 mg via INTRAMUSCULAR

## 2010-11-06 MED ORDER — OXYTOCIN 20 UNITS IN LACTATED RINGERS INFUSION - SIMPLE
INTRAVENOUS | Status: AC
  Filled 2010-11-06: qty 1000

## 2010-11-06 MED ORDER — PHENYLEPHRINE HCL 10 MG/ML IJ SOLN
INTRAMUSCULAR | Status: DC | PRN
  Administered 2010-11-06: 80 ug via INTRAVENOUS

## 2010-11-06 MED ORDER — BUDESONIDE-FORMOTEROL FUMARATE 80-4.5 MCG/ACT IN AERO
2.0000 | INHALATION_SPRAY | Freq: Two times a day (BID) | RESPIRATORY_TRACT | Status: DC
  Administered 2010-11-07: 2 via RESPIRATORY_TRACT
  Filled 2010-11-06: qty 6.9

## 2010-11-06 MED ORDER — BUDESONIDE 32 MCG/ACT NA SUSP
1.0000 | Freq: Every day | NASAL | Status: DC
  Filled 2010-11-06: qty 8.6

## 2010-11-06 MED ORDER — DIPHENHYDRAMINE HCL 50 MG/ML IJ SOLN: 25.0000 mg | INTRAMUSCULAR | Status: DC | PRN

## 2010-11-06 MED ORDER — CEFAZOLIN SODIUM 1-5 GM-% IV SOLN
1.0000 g | Freq: Once | INTRAVENOUS | Status: AC
  Administered 2010-11-06: 1 g via INTRAVENOUS

## 2010-11-06 MED ORDER — LACTATED RINGERS IV SOLN
Freq: Once | INTRAVENOUS | Status: AC
  Administered 2010-11-06: 09:00:00 via INTRAVENOUS
  Administered 2010-11-06: 1000 mL via INTRAVENOUS
  Administered 2010-11-06: 09:00:00 via INTRAVENOUS

## 2010-11-06 MED ORDER — DIPHENHYDRAMINE HCL 25 MG PO CAPS: 25.0000 mg | ORAL_CAPSULE | ORAL | Status: DC | PRN

## 2010-11-06 MED ORDER — IBUPROFEN 600 MG PO TABS
600.0000 mg | ORAL_TABLET | Freq: Four times a day (QID) | ORAL | Status: DC
  Administered 2010-11-06 – 2010-11-08 (×8): 600 mg via ORAL
  Filled 2010-11-06 (×8): qty 1

## 2010-11-06 MED ORDER — IBUPROFEN 600 MG PO TABS: 600.0000 mg | ORAL_TABLET | Freq: Four times a day (QID) | ORAL | Status: DC | PRN

## 2010-11-06 MED ORDER — OXYTOCIN 20 UNITS IN LACTATED RINGERS INFUSION - SIMPLE
INTRAVENOUS | Status: DC | PRN
  Administered 2010-11-06: 20 [IU] via INTRAVENOUS

## 2010-11-06 MED ORDER — DIBUCAINE 1 % RE OINT: 1.0000 "application " | TOPICAL_OINTMENT | RECTAL | Status: DC | PRN

## 2010-11-06 MED ORDER — SCOPOLAMINE 1 MG/3DAYS TD PT72
1.0000 | MEDICATED_PATCH | Freq: Once | TRANSDERMAL | Status: DC
  Administered 2010-11-06: 1.5 mg via TRANSDERMAL

## 2010-11-06 MED ORDER — ONDANSETRON HCL 4 MG/2ML IJ SOLN: 4.0000 mg | Freq: Three times a day (TID) | INTRAMUSCULAR | Status: DC | PRN

## 2010-11-06 SURGICAL SUPPLY — 33 items
CHLORAPREP W/TINT 26ML (MISCELLANEOUS) ×2 IMPLANT
CLOSURE STERI STRIP 1/2 X4 (GAUZE/BANDAGES/DRESSINGS) ×2 IMPLANT
CLOTH BEACON ORANGE TIMEOUT ST (SAFETY) ×2 IMPLANT
CONTAINER PREFILL 10% NBF 15ML (MISCELLANEOUS) IMPLANT
DRESSING TELFA 8X3 (GAUZE/BANDAGES/DRESSINGS) IMPLANT
DRSG COVADERM 4X6 (GAUZE/BANDAGES/DRESSINGS) ×2 IMPLANT
ELECT REM PT RETURN 9FT ADLT (ELECTROSURGICAL) ×2
ELECTRODE REM PT RTRN 9FT ADLT (ELECTROSURGICAL) ×1 IMPLANT
GAUZE SPONGE 4X4 12PLY STRL LF (GAUZE/BANDAGES/DRESSINGS) IMPLANT
GLOVE BIO SURGEON STRL SZ 6.5 (GLOVE) ×4 IMPLANT
GOWN PREVENTION PLUS LG XLONG (DISPOSABLE) ×6 IMPLANT
KIT ABG SYR 3ML LUER SLIP (SYRINGE) IMPLANT
NEEDLE HYPO 25X5/8 SAFETYGLIDE (NEEDLE) IMPLANT
NEEDLE SPNL 18GX3.5 QUINCKE PK (NEEDLE) ×2 IMPLANT
NS IRRIG 1000ML POUR BTL (IV SOLUTION) ×2 IMPLANT
PACK C SECTION WH (CUSTOM PROCEDURE TRAY) ×2 IMPLANT
PAD ABD 7.5X8 STRL (GAUZE/BANDAGES/DRESSINGS) IMPLANT
SLEEVE SCD COMPRESS KNEE MED (MISCELLANEOUS) IMPLANT
SUT PDS AB 0 CTX 60 (SUTURE) IMPLANT
SUT VIC AB 0 CT1 27 (SUTURE)
SUT VIC AB 0 CT1 27XBRD ANBCTR (SUTURE) IMPLANT
SUT VIC AB 0 CT1 36 (SUTURE) IMPLANT
SUT VIC AB 2-0 CT1 27 (SUTURE) ×1
SUT VIC AB 2-0 CT1 TAPERPNT 27 (SUTURE) ×1 IMPLANT
SUT VIC AB 2-0 CTX 36 (SUTURE) ×4 IMPLANT
SUT VIC AB 3-0 CT1 27 (SUTURE) ×1
SUT VIC AB 3-0 CT1 TAPERPNT 27 (SUTURE) ×1 IMPLANT
SUT VIC AB 3-0 SH 27 (SUTURE)
SUT VIC AB 3-0 SH 27X BRD (SUTURE) IMPLANT
SYR 30ML LL (SYRINGE) ×2 IMPLANT
TOWEL OR 17X24 6PK STRL BLUE (TOWEL DISPOSABLE) ×4 IMPLANT
TRAY FOLEY CATH 14FR (SET/KITS/TRAYS/PACK) ×2 IMPLANT
WATER STERILE IRR 1000ML POUR (IV SOLUTION) IMPLANT

## 2010-11-06 NOTE — Anesthesia Postprocedure Evaluation (Signed)
  Anesthesia Post-op Note  Patient: April Daniels  Procedure(s) Performed:  CESAREAN SECTION  Patient is awake, responsive, moving her legs, and has signs of resolution of her numbness. Pain and nausea are reasonably well controlled. Vital signs are stable and clinically acceptable. Oxygen saturation is clinically acceptable. There are no apparent anesthetic complications at this time. Patient is ready for discharge.

## 2010-11-06 NOTE — Consult Note (Signed)
Requested to attend repeat elective C/S at term gestation with no risk factors reported by Dr. [name]. At delivery infant in vertex and was manually extracted w/o assistance. A true knot was present in the cord.  Following birth the infant has hyperventilated  Even after being wrapped in a pre warmed blanket.  Given tactile stim and bulb suction to naso/oropharynx. No dysmorphic features.  Shown to parents then carried to Transitional Nursery in warm blanket.   Care to assigned pediatrician.  Dagoberto Ligas MD [facility]

## 2010-11-06 NOTE — Transfer of Care (Signed)
Immediate Anesthesia Transfer of Care Note  Patient: April Daniels  Procedure(s) Performed:  CESAREAN SECTION  Patient Location: PACU  Anesthesia Type: Spinal  Level of Consciousness: awake, alert  and oriented  Airway & Oxygen Therapy: Patient Spontanous Breathing  Post-op Assessment: Report given to PACU RN and Post -op Vital signs reviewed and stable  Post vital signs: Reviewed and stable  Complications: No apparent anesthesia complications

## 2010-11-06 NOTE — Anesthesia Procedure Notes (Signed)
Spinal Block  Staffing Anesthesiologist: Jiles Garter Additional Notes Spinal Dosage in OR  Bupivicaine ml       1.6 PFMS04   mcg        150 Fentanyl mcg            20

## 2010-11-06 NOTE — Anesthesia Postprocedure Evaluation (Signed)
  Anesthesia Post-op Note  Patient: April Daniels  Procedure(s) Performed:  CESAREAN SECTION  Patient Location: PACU and Mother/Baby  Anesthesia Type: Spinal  Level of Consciousness: awake, alert , oriented and patient cooperative  Airway and Oxygen Therapy: Patient Spontanous Breathing  Post-op Pain: mild  Post-op Assessment: Post-op Vital signs reviewed and Patient's Cardiovascular Status Stable  Post-op Vital Signs: Reviewed and stable  Complications: No apparent anesthesia complications

## 2010-11-06 NOTE — Anesthesia Preprocedure Evaluation (Signed)
Anesthesia Evaluation  Name, MR# and DOB Patient awake  General Assessment Comment  Reviewed: Allergy & Precautions, H&P , Patient's Chart, lab work & pertinent test results  Airway Mallampati: II TM Distance: >3 FB Neck ROM: full    Dental No notable dental hx.    Pulmonary  clear to auscultation  Pulmonary exam normal       Cardiovascular Exercise Tolerance: Good - dysrhythmias regular Normal    Neuro/Psych    GI/Hepatic   Endo/Other  Morbid obesity  Renal/GU      Musculoskeletal   Abdominal   Peds  Hematology   Anesthesia Other Findings   Reproductive/Obstetrics                           Anesthesia Physical Anesthesia Plan  ASA: III  Anesthesia Plan: Spinal   Post-op Pain Management:    Induction:   Airway Management Planned:   Additional Equipment:   Intra-op Plan:   Post-operative Plan:   Informed Consent: I have reviewed the patients History and Physical, chart, labs and discussed the procedure including the risks, benefits and alternatives for the proposed anesthesia with the patient or authorized representative who has indicated his/her understanding and acceptance.   Dental Advisory Given  Plan Discussed with: CRNA  Anesthesia Plan Comments: (Lab work confirmed with CRNA in room. Platelets okay. Discussed spinal anesthetic, and patient consents to the procedure:  included risk of possible headache,backache, failed block, allergic reaction, and nerve injury. This patient was asked if she had any questions or concerns before the procedure started. )        Anesthesia Quick Evaluation

## 2010-11-06 NOTE — Progress Notes (Signed)
Encounter addended by: Marrion Coy on: 11/06/2010  4:06 PM<BR>     Documentation filed: Notes Section

## 2010-11-06 NOTE — H&P (Signed)
April Daniels is a 35 y.o. female G2P1001 presenting for elective repeat c-section. Her first c-section was an elective primary due to patient's elevated blood pressure; she declined IOL at that time. She decline TOLAC with this pregnancy and opted for a repeat c-section. Other surgeries include appendectomy. She has history of allergic asthma and takes symbicort, although she reports not taking it every day.  Maternal Medical History:  Reason for admission: Desired Repeat C-Section  Contractions: No contractions.  Fetal activity: Perceived fetal activity is normal.   Last perceived fetal movement was within the past hour.    Prenatal complications: No bleeding, hypertension, infection, pre-eclampsia or preterm labor.   Prenatal Complications - Diabetes: none.    OB History    Grav Para Term Preterm Abortions TAB SAB Ect Mult Living   2 1 1   0 0  0 0 1     Past Medical History  Diagnosis Date  . Allergy-induced asthma   . GERD (gastroesophageal reflux disease)     with pregnancy  . Asthma     allergy induced   Past Surgical History  Procedure Date  . Appendectomy   . Cesarean section    Family History: family history is not on file. Social History:  reports that she quit smoking about 4 years ago. Her smoking use included Cigarettes. She does not have any smokeless tobacco history on file. She reports that she does not drink alcohol or use illicit drugs.  Review of Systems  Constitutional: Negative.   HENT: Negative.   Eyes: Negative.   Respiratory: Negative.   Cardiovascular: Negative.   Gastrointestinal: Negative.   Genitourinary: Negative.   Musculoskeletal: Negative.   Skin: Negative.   Neurological: Negative.   Psychiatric/Behavioral: Negative.       Blood pressure 143/98, pulse 81, temperature 98.4 F (36.9 C), temperature source Oral, resp. rate 16, height 5\' 3"  (1.6 m), weight 225 lb (102.059 kg), last menstrual period 02/06/2010, SpO2  100.00%. Maternal Exam:  Abdomen: Surgical scars: low transverse.   Estimated fetal weight is 7 pounds.   Fetal presentation: vertex     Physical Exam  Constitutional: She is oriented to person, place, and time. She appears well-developed and well-nourished. No distress.  HENT:  Head: Normocephalic.  Eyes: Pupils are equal, round, and reactive to light.  Neck: Normal range of motion.  Cardiovascular: Normal rate, regular rhythm, normal heart sounds and intact distal pulses.  Exam reveals no gallop and no friction rub.   No murmur heard. Respiratory: Effort normal and breath sounds normal. No respiratory distress. She has no wheezes. She has no rales. She exhibits no tenderness.  GI: Soft. Bowel sounds are normal. She exhibits no distension. There is no tenderness.  Musculoskeletal: Normal range of motion.  Neurological: She is alert and oriented to person, place, and time. She has normal reflexes. She displays normal reflexes. No cranial nerve deficit. She exhibits normal muscle tone. Coordination normal.  Skin: Skin is warm and dry. No rash noted. She is not diaphoretic. No erythema. No pallor.  Psychiatric: She has a normal mood and affect.    Prenatal labs: ABO, Rh: O/POS/-- (02/20 2153) Antibody: NEG (02/20 2153) Rubella: 219.3 (02/20 2153) RPR: NON REACTIVE (09/28 0900)  HBsAg: NEGATIVE (02/20 2153)  HIV: NON REACTIVE (07/12 1716)  GBS: NEGATIVE (09/18 1734)   Assessment/Plan: Admit for repeat c-section and routine post partum/ post op care. Pt desires to breastfeed and wishes to use progesterone only OCPs for contraception. She does want  to have her baby boy circumcised after delivery prior to discharge home.  Kahner Yanik N 11/06/2010, 8:10 AM

## 2010-11-06 NOTE — Op Note (Signed)
Cesarean Section Procedure Note   April Daniels  11/06/2010  Indications: Scheduled Proceedure/Maternal Request   Pre-operative Diagnosis: Previous cesarean section, .   Post-operative Diagnosis: Same   Surgeon: Surgeon(s) and Role:    * Myra C. [name], MD - Primary   Assistants: Lucina Mellow, DO and Janeece Riggers, PA-S  Anesthesia: spinal   Procedure Details:  The patient was seen in the Holding Room. The risks, benefits, complications, treatment options, and expected outcomes were discussed with the patient. The patient concurred with the proposed plan, giving informed consent. identified as [name] and the procedure verified as C-Section Delivery. A Time Out was held and the above information confirmed.  After induction of anesthesia, the patient was draped and prepped in the usual sterile manner. A transverse incision was made and carried down through the subcutaneous tissue to the fascia. Fascial incision was made and extended transversely. The rectus muscles were transected laterally with the bovie 1/3 of their width. The peritoneum was identified and entered. Peritoneal incision was extended longitudinally. Adhesions were taken down from the uterine peritoneum. A low transverse uterine incision was made. Delivered from cephalic presentation was a 3870 gram Living newborn infant Female with Apgar scores of 9 at one minute and 9 at five minutes. Cord ph was not sent the umbilical cord was clamped and cut cord blood was obtained for evaluation. The placenta was removed Intact and appeared normal. The uterine outline, tubes and ovaries appeared normal. The uterine incision was closed with running locked sutures of Vicryl with 2 layers. Hemostasis was observed. The fascia was then reapproximated with running sutures of PDS on a looped needle. The skin was closed with 3-0 Vicryl.   Instrument, sponge, and needle counts were correct prior the abdominal closure and were correct  at the conclusion of the case.    Findings: Viable Infant Female, normal uterus, tubes, ovaries   Estimated Blood Loss: 600 mL  Total IV Fluids: 2800 mL   Urine Output: 250 mL  Specimens: Placenta to pathology   Complications: no complications  Disposition: PACU - hemodynamically stable.   Maternal Condition: stable   Baby condition / location:  nursery-stable

## 2010-11-07 LAB — CBC
HCT: 26.1 % — ABNORMAL LOW (ref 36.0–46.0)
Hemoglobin: 8.8 g/dL — ABNORMAL LOW (ref 12.0–15.0)
MCH: 30.6 pg (ref 26.0–34.0)
MCHC: 33.7 g/dL (ref 30.0–36.0)
MCV: 90.6 fL (ref 78.0–100.0)
Platelets: 156 10*3/uL (ref 150–400)
RBC: 2.88 MIL/uL — ABNORMAL LOW (ref 3.87–5.11)
RDW: 13.6 % (ref 11.5–15.5)
WBC: 10.4 10*3/uL (ref 4.0–10.5)

## 2010-11-07 NOTE — Progress Notes (Signed)
Post Partum Day 1 post op [facility] Subjective: no complaints, up ad lib, voiding and tolerating PO, small lochia, plans to breastfeed, oral progesterone-only contraceptive  Objective: Blood pressure 113/63, pulse 60, temperature 97.6 F (36.4 C), temperature source Oral, resp. rate 18, height 5\' 3"  (1.6 m), weight 225 lb (102.059 kg), last menstrual period 02/06/2010, SpO2 97.00%, unknown if currently breastfeeding.  Physical Exam:  General: alert, cooperative and no distress Lochia:normal flow Chest: CTAB Heart: RRR no m/r/g Abdomen: +BS, soft, nontender, area of drainage marked on dsg from last night appears stable. + flatus Uterine Fundus: firm DVT Evaluation: No evidence of DVT seen on physical exam. Extremities:1+ edema   Basename 11/07/10 0520 11/06/10 0839  HGB 8.8* 11.5*  HCT 26.1* 34.2*    Assessment/Plan: Circumcision prior to discharge Remove dsg in shower and observe for further drainage   LOS: 1 day   CRESENZO-DISHMAN,Danzig Macgregor 11/07/2010, 7:58 AM

## 2010-11-08 MED ORDER — IBUPROFEN 800 MG PO TABS: 800.0000 mg | ORAL_TABLET | Freq: Three times a day (TID) | ORAL | Status: AC

## 2010-11-08 MED ORDER — DOCUSATE SODIUM 100 MG PO CAPS: 100.0000 mg | ORAL_CAPSULE | Freq: Two times a day (BID) | ORAL | Status: AC

## 2010-11-08 MED ORDER — INFLUENZA VIRUS VACC SPLIT PF IM SUSP
0.5000 mL | Freq: Once | INTRAMUSCULAR | Status: DC
  Filled 2010-11-08: qty 0.5

## 2010-11-08 MED ORDER — NORETHINDRONE 0.35 MG PO TABS: 1.0000 | ORAL_TABLET | Freq: Every day | ORAL | Status: DC

## 2010-11-08 NOTE — Discharge Summary (Signed)
Obstetric Discharge Summary Reason for Admission: cesarean section Prenatal Procedures: none Intrapartum Procedures: cesarean: low cervical, transverse Postpartum Procedures: none Complications-Operative and Postpartum: none Hemoglobin  Date Value Range Status  11/07/2010 8.8* 12.0-15.0 (g/dL) Final     DELTA CHECK NOTED     REPEATED TO VERIFY     HCT  Date Value Range Status  11/07/2010 26.1* 36.0-46.0 (%) Final    Discharge Diagnoses: Term Pregnancy-delivered  Discharge Information: Date: 11/08/2010 Activity: pelvic rest Diet: routine Medications: Ibuprofen and Colace Condition: stable Instructions: refer to practice specific booklet Discharge to: home Follow-up Information    Follow up with [facility]. Make an appointment in 6 weeks. (postpartum check)    Contact information:   [address] [place] [address]          Newborn Data: Live born female  Birth Weight: 8 lb 8.5 oz (3870 g) APGAR: 9, 9  Home with mother.  STINSON, JACOB JEHIEL 11/08/2010, 12:21 PM

## 2010-11-08 NOTE — Progress Notes (Signed)
Subjective: Postpartum Day 2: Cesarean Delivery Patient reports incisional pain, tolerating PO, + flatus, + BM and no problems voiding.    Objective: Vital signs in last 24 hours: Temp:  [97.8 F (36.6 C)-98 F (36.7 C)] 97.8 F (36.6 C) (10/06 0537) Pulse Rate:  [68-92] 92  (10/06 0537) Resp:  [18-20] 20  (10/06 0537) BP: (110-131)/(78-86) 131/86 mmHg (10/06 0537)  Physical Exam:  General: alert, cooperative and no distress Heart: RRR, no murmur Lungs: CTA B/L Abd: +BS, soft Lochia: appropriate Uterine Fundus: firm Incision: healing well DVT Evaluation: No evidence of DVT seen on physical exam.   Basename 11/07/10 0520 11/06/10 0839  HGB 8.8* 11.5*  HCT 26.1* 34.2*    Assessment/Plan: Status post Cesarean section. Doing well postoperatively.  Continue current care. Breastfeeding well. May opt to go home later today.  Elohim Brune N 11/08/2010, 8:09 AM

## 2010-11-10 ENCOUNTER — Encounter (HOSPITAL_COMMUNITY): Payer: Self-pay | Admitting: Obstetrics & Gynecology

## 2010-12-22 ENCOUNTER — Ambulatory Visit (INDEPENDENT_AMBULATORY_CARE_PROVIDER_SITE_OTHER): Payer: Managed Care, Other (non HMO) | Admitting: Obstetrics & Gynecology

## 2010-12-22 ENCOUNTER — Encounter: Payer: Self-pay | Admitting: Obstetrics & Gynecology

## 2010-12-22 NOTE — Progress Notes (Signed)
  Subjective:    Patient ID: April Daniels, female    DOB: Jan 18, 1976, 35 y.o.   MRN: [id number]  HPI    Review of Systems     Objective:   Physical Exam        Assessment & Plan:   Subjective:     April Daniels is a 35 y.o. female who presents for a postpartum visit. She is 6 weeks postpartum following a low cervical transverse Cesarean section. I have fully reviewed the prenatal and intrapartum course. The delivery was at 39 gestational weeks. Outcome: repeat cesarean section, low transverse incision. Anesthesia: spinal. Postpartum course has been good. Baby's course has been good. Baby is feeding by bottle - [employer] Good Start. Bleeding no bleeding. Bowel function is normal. Bladder function is normal. Patient is not sexually active. Contraception method is OCP (estrogen/progesterone). Postpartum depression screening: negative. She has her Tri sprintec at home. She recently quit breast feeding. She will return to work in January.  The following portions of the patient's history were reviewed and updated as appropriate: allergies, current medications, past family history, past medical history, past social history, past surgical history and problem list.  Review of Systems Pertinent items are noted in HPI.   Objective:    BP 120/73  Pulse 47  Ht 5\' 3"  (1.6 m)  Wt 193 lb 8 oz (87.771 kg)  BMI 34.28 kg/m2  Breastfeeding? No  General:  alert   Breasts:  negative  Lungs: clear to auscultation bilaterally  Heart:  regular rate and rhythm, S1, S2 normal, no murmur, click, rub or gallop  Abdomen: soft, non-tender; bowel sounds normal; no masses,  no organomegaly   Vulva:  normal  Vagina: not evaluated  Cervix:  not evaluated  Corpus: well-involuted  Adnexa:  not evaluated  Rectal Exam: Not performed.        Assessment:    Normal postpartum exam. Pap smear not done at today's visit.   Plan:    1. Contraception: OCP (estrogen/progesterone) 2. She will get  her annual in 2-3 months.

## 2011-02-19 ENCOUNTER — Encounter: Payer: Self-pay | Admitting: Obstetrics & Gynecology

## 2011-02-19 ENCOUNTER — Ambulatory Visit (INDEPENDENT_AMBULATORY_CARE_PROVIDER_SITE_OTHER): Payer: Self-pay | Admitting: Obstetrics & Gynecology

## 2011-02-19 VITALS — BP 122/90 | HR 84 | Ht 63.0 in | Wt 192.0 lb

## 2011-02-19 DIAGNOSIS — Z1272 Encounter for screening for malignant neoplasm of vagina: Secondary | ICD-10-CM

## 2011-02-19 DIAGNOSIS — Z0000 Encounter for general adult medical examination without abnormal findings: Secondary | ICD-10-CM

## 2011-02-19 MED ORDER — NORGESTIM-ETH ESTRAD TRIPHASIC 0.18/0.215/0.25 MG-35 MCG PO TABS: 1.0000 | ORAL_TABLET | Freq: Every day | ORAL | Status: DC

## 2011-02-19 NOTE — Progress Notes (Signed)
Subjective:    April Daniels is a 36 y.o. female who presents for an annual exam. The patient has no complaints today. She needs a refill on her OCPs. The patient is sexually active. GYN screening history: last pap: was normal. The patient wears seatbelts: yes. The patient participates in regular exercise: no. Has the patient ever been transfused or tattooed?: not asked. The patient reports that there is not domestic violence in her life.   Menstrual History: OB History    Grav Para Term Preterm Abortions TAB SAB Ect Mult Living   2 2 2   0 0  0 0 2      Menarche age: 68 Patient's last menstrual period was 02/05/2011.    The following portions of the patient's history were reviewed and updated as appropriate: allergies, current medications, past family history, past medical history, past social history, past surgical history and problem list.  Review of Systems A comprehensive review of systems was negative.    Objective:    BP 122/90  Pulse 84  Ht 5\' 3"  (1.6 m)  Wt 192 lb (87.091 kg)  BMI 34.01 kg/m2  LMP 02/05/2011  Breastfeeding? No  General Appearance:    Alert, cooperative, no distress, appears stated age  Head:    Normocephalic, without obvious abnormality, atraumatic  Eyes:    PERRL, conjunctiva/corneas clear, EOM's intact, fundi    benign, both eyes  Ears:    Normal TM's and external ear canals, both ears  Nose:   Nares normal, septum midline, mucosa normal, no drainage    or sinus tenderness  Throat:   Lips, mucosa, and tongue normal; teeth and gums normal  Neck:   Supple, symmetrical, trachea midline, no adenopathy;    thyroid:  no enlargement/tenderness/nodules; no carotid   bruit or JVD  Back:     Symmetric, no curvature, ROM normal, no CVA tenderness  Lungs:     Clear to auscultation bilaterally, respirations unlabored  Chest Wall:    No tenderness or deformity   Heart:    Regular rate and rhythm, S1 and S2 normal, no murmur, rub   or gallop  Breast Exam:     No tenderness, masses, or nipple abnormality  Abdomen:     Soft, non-tender, bowel sounds active all four quadrants,    no masses, no organomegaly  Genitalia:    Normal female without lesion, discharge or tenderness, NSSA, NT, no adnexal masses     Extremities:   Extremities normal, atraumatic, no cyanosis or edema  Pulses:   2+ and symmetric all extremities  Skin:   Skin color, texture, turgor normal, no rashes or lesions  Lymph nodes:   Cervical, supraclavicular, and axillary nodes normal  Neurologic:   CNII-XII intact, normal strength, sensation and reflexes    throughout  .    Assessment:    Healthy female exam.    Plan:     Discussed healthy lifestyle modifications. Pap smear.

## 2012-01-10 ENCOUNTER — Other Ambulatory Visit: Payer: Self-pay | Admitting: Obstetrics & Gynecology

## 2012-03-30 ENCOUNTER — Telehealth: Payer: Self-pay | Admitting: Family Medicine

## 2012-03-30 NOTE — Telephone Encounter (Signed)
Pt used to be your pt but changed her PCP after she moved to [facility], but she has moved back to [place] and would like to re-establish w/you as her PCP. She says her husband and mother both are your patients. Is it ok to sch an apptmt to re-est her w/you or do I need to sch her w/another physician.

## 2012-03-30 NOTE — Telephone Encounter (Signed)
That is fine - please put her in for a 30 min appt to re est-thanks

## 2012-03-30 NOTE — Telephone Encounter (Signed)
appt scheduled

## 2012-04-04 ENCOUNTER — Encounter: Payer: Self-pay | Admitting: Obstetrics & Gynecology

## 2012-04-04 ENCOUNTER — Ambulatory Visit (INDEPENDENT_AMBULATORY_CARE_PROVIDER_SITE_OTHER): Payer: BC Managed Care – PPO | Admitting: Obstetrics & Gynecology

## 2012-04-04 VITALS — BP 120/92 | HR 90 | Ht 63.0 in | Wt 190.0 lb

## 2012-04-04 DIAGNOSIS — Z01419 Encounter for gynecological examination (general) (routine) without abnormal findings: Secondary | ICD-10-CM

## 2012-04-04 DIAGNOSIS — Z124 Encounter for screening for malignant neoplasm of cervix: Secondary | ICD-10-CM

## 2012-04-04 DIAGNOSIS — Z309 Encounter for contraceptive management, unspecified: Secondary | ICD-10-CM

## 2012-04-04 DIAGNOSIS — Z1151 Encounter for screening for human papillomavirus (HPV): Secondary | ICD-10-CM

## 2012-04-04 MED ORDER — NORGESTIM-ETH ESTRAD TRIPHASIC 0.18/0.215/0.25 MG-35 MCG PO TABS: ORAL_TABLET | ORAL | Status: DC

## 2012-04-04 NOTE — Patient Instructions (Signed)
Preventive Care for Adults, Female A healthy lifestyle and preventive care can promote health and wellness. Preventive health guidelines for women include the following key practices.  A routine yearly physical is a good way to check with your caregiver about your health and preventive screening. It is a chance to share any concerns and updates on your health, and to receive a thorough exam.  Visit your dentist for a routine exam and preventive care every 6 months. Brush your teeth twice a day and floss once a day. Good oral hygiene prevents tooth decay and gum disease.  The frequency of eye exams is based on your age, health, family medical history, use of contact lenses, and other factors. Follow your caregiver's recommendations for frequency of eye exams.  Eat a healthy diet. Foods like vegetables, fruits, whole grains, low-fat dairy products, and lean protein foods contain the nutrients you need without too many calories. Decrease your intake of foods high in solid fats, added sugars, and salt. Eat the right amount of calories for you.Get information about a proper diet from your caregiver, if necessary.  Regular physical exercise is one of the most important things you can do for your health. Most adults should get at least 150 minutes of moderate-intensity exercise (any activity that increases your heart rate and causes you to sweat) each week. In addition, most adults need muscle-strengthening exercises on 2 or more days a week.  Maintain a healthy weight. The body mass index (BMI) is a screening tool to identify possible weight problems. It provides an estimate of body fat based on height and weight. Your caregiver can help determine your BMI, and can help you achieve or maintain a healthy weight.For adults 20 years and older:  A BMI below 18.5 is considered underweight.  A BMI of 18.5 to 24.9 is normal.  A BMI of 25 to 29.9 is considered overweight.  A BMI of 30 and above is  considered obese.  Maintain normal blood lipids and cholesterol levels by exercising and minimizing your intake of saturated fat. Eat a balanced diet with plenty of fruit and vegetables. Blood tests for lipids and cholesterol should begin at age 20 and be repeated every 5 years. If your lipid or cholesterol levels are high, you are over 50, or you are at high risk for heart disease, you may need your cholesterol levels checked more frequently.Ongoing high lipid and cholesterol levels should be treated with medicines if diet and exercise are not effective.  If you smoke, find out from your caregiver how to quit. If you do not use tobacco, do not start.  If you are pregnant, do not drink alcohol. If you are breastfeeding, be very cautious about drinking alcohol. If you are not pregnant and choose to drink alcohol, do not exceed 1 drink per day. One drink is considered to be 12 ounces (355 mL) of beer, 5 ounces (148 mL) of wine, or 1.5 ounces (44 mL) of liquor.  Avoid use of street drugs. Do not share needles with anyone. Ask for help if you need support or instructions about stopping the use of drugs.  High blood pressure causes heart disease and increases the risk of stroke. Your blood pressure should be checked at least every 1 to 2 years. Ongoing high blood pressure should be treated with medicines if weight loss and exercise are not effective.  If you are 55 to 36 years old, ask your caregiver if you should take aspirin to prevent strokes.  Diabetes   screening involves taking a blood sample to check your fasting blood sugar level. This should be done once every 3 years, after age 45, if you are within normal weight and without risk factors for diabetes. Testing should be considered at a younger age or be carried out more frequently if you are overweight and have at least 1 risk factor for diabetes.  Breast cancer screening is essential preventive care for women. You should practice "breast  self-awareness." This means understanding the normal appearance and feel of your breasts and may include breast self-examination. Any changes detected, no matter how small, should be reported to a caregiver. Women in their 20s and 30s should have a clinical breast exam (CBE) by a caregiver as part of a regular health exam every 1 to 3 years. After age 40, women should have a CBE every year. Starting at age 40, women should consider having a mammography (breast X-ray test) every year. Women who have a family history of breast cancer should talk to their caregiver about genetic screening. Women at a high risk of breast cancer should talk to their caregivers about having magnetic resonance imaging (MRI) and a mammography every year.  The Pap test is a screening test for cervical cancer. A Pap test can show cell changes on the cervix that might become cervical cancer if left untreated. A Pap test is a procedure in which cells are obtained and examined from the lower end of the uterus (cervix).  Women should have a Pap test starting at age 21.  Between ages 21 and 29, Pap tests should be repeated every 2 years.  Beginning at age 30, you should have a Pap test every 3 years as long as the past 3 Pap tests have been normal.  Some women have medical problems that increase the chance of getting cervical cancer. Talk to your caregiver about these problems. It is especially important to talk to your caregiver if a new problem develops soon after your last Pap test. In these cases, your caregiver may recommend more frequent screening and Pap tests.  The above recommendations are the same for women who have or have not gotten the vaccine for human papillomavirus (HPV).  If you had a hysterectomy for a problem that was not cancer or a condition that could lead to cancer, then you no longer need Pap tests. Even if you no longer need a Pap test, a regular exam is a good idea to make sure no other problems are  starting.  If you are between ages 65 and 70, and you have had normal Pap tests going back 10 years, you no longer need Pap tests. Even if you no longer need a Pap test, a regular exam is a good idea to make sure no other problems are starting.  If you have had past treatment for cervical cancer or a condition that could lead to cancer, you need Pap tests and screening for cancer for at least 20 years after your treatment.  If Pap tests have been discontinued, risk factors (such as a new sexual partner) need to be reassessed to determine if screening should be resumed.  The HPV test is an additional test that may be used for cervical cancer screening. The HPV test looks for the virus that can cause the cell changes on the cervix. The cells collected during the Pap test can be tested for HPV. The HPV test could be used to screen women aged 30 years and older, and should   be used in women of any age who have unclear Pap test results. After the age of 30, women should have HPV testing at the same frequency as a Pap test.  Colorectal cancer can be detected and often prevented. Most routine colorectal cancer screening begins at the age of 50 and continues through age 75. However, your caregiver may recommend screening at an earlier age if you have risk factors for colon cancer. On a yearly basis, your caregiver may provide home test kits to check for hidden blood in the stool. Use of a small camera at the end of a tube, to directly examine the colon (sigmoidoscopy or colonoscopy), can detect the earliest forms of colorectal cancer. Talk to your caregiver about this at age 50, when routine screening begins. Direct examination of the colon should be repeated every 5 to 10 years through age 75, unless early forms of pre-cancerous polyps or small growths are found.  Hepatitis C blood testing is recommended for all people born from 1945 through 1965 and any individual with known risks for hepatitis C.  Practice  safe sex. Use condoms and avoid high-risk sexual practices to reduce the spread of sexually transmitted infections (STIs). STIs include gonorrhea, chlamydia, syphilis, trichomonas, herpes, HPV, and human immunodeficiency virus (HIV). Herpes, HIV, and HPV are viral illnesses that have no cure. They can result in disability, cancer, and death. Sexually active women aged 25 and younger should be checked for chlamydia. Older women with new or multiple partners should also be tested for chlamydia. Testing for other STIs is recommended if you are sexually active and at increased risk.  Osteoporosis is a disease in which the bones lose minerals and strength with aging. This can result in serious bone fractures. The risk of osteoporosis can be identified using a bone density scan. Women ages 65 and over and women at risk for fractures or osteoporosis should discuss screening with their caregivers. Ask your caregiver whether you should take a calcium supplement or vitamin D to reduce the rate of osteoporosis.  Menopause can be associated with physical symptoms and risks. Hormone replacement therapy is available to decrease symptoms and risks. You should talk to your caregiver about whether hormone replacement therapy is right for you.  Use sunscreen with sun protection factor (SPF) of 30 or more. Apply sunscreen liberally and repeatedly throughout the day. You should seek shade when your shadow is shorter than you. Protect yourself by wearing long sleeves, pants, a wide-brimmed hat, and sunglasses year round, whenever you are outdoors.  Once a month, do a whole body skin exam, using a mirror to look at the skin on your back. Notify your caregiver of new moles, moles that have irregular borders, moles that are larger than a pencil eraser, or moles that have changed in shape or color.  Stay current with required immunizations.  Influenza. You need a dose every fall (or winter). The composition of the flu vaccine  changes each year, so being vaccinated once is not enough.  Pneumococcal polysaccharide. You need 1 to 2 doses if you smoke cigarettes or if you have certain chronic medical conditions. You need 1 dose at age 65 (or older) if you have never been vaccinated.  Tetanus, diphtheria, pertussis (Tdap, Td). Get 1 dose of Tdap vaccine if you are younger than age 65, are over 65 and have contact with an infant, are a healthcare worker, are pregnant, or simply want to be protected from whooping cough. After that, you need a Td   booster dose every 10 years. Consult your caregiver if you have not had at least 3 tetanus and diphtheria-containing shots sometime in your life or have a deep or dirty wound.  HPV. You need this vaccine if you are a woman age 26 or younger. The vaccine is given in 3 doses over 6 months.  Measles, mumps, rubella (MMR). You need at least 1 dose of MMR if you were born in 1957 or later. You may also need a second dose.  Meningococcal. If you are age 19 to 21 and a first-year college student living in a residence hall, or have one of several medical conditions, you need to get vaccinated against meningococcal disease. You may also need additional booster doses.  Zoster (shingles). If you are age 60 or older, you should get this vaccine.  Varicella (chickenpox). If you have never had chickenpox or you were vaccinated but received only 1 dose, talk to your caregiver to find out if you need this vaccine.  Hepatitis A. You need this vaccine if you have a specific risk factor for hepatitis A virus infection or you simply wish to be protected from this disease. The vaccine is usually given as 2 doses, 6 to 18 months apart.  Hepatitis B. You need this vaccine if you have a specific risk factor for hepatitis B virus infection or you simply wish to be protected from this disease. The vaccine is given in 3 doses, usually over 6 months. Preventive Services / Frequency Ages 19 to 39  Blood  pressure check.** / Every 1 to 2 years.  Lipid and cholesterol check.** / Every 5 years beginning at age 20.  Clinical breast exam.** / Every 3 years for women in their 20s and 30s.  Pap test.** / Every 2 years from ages 21 through 29. Every 3 years starting at age 30 through age 65 or 70 with a history of 3 consecutive normal Pap tests.  HPV screening.** / Every 3 years from ages 30 through ages 65 to 70 with a history of 3 consecutive normal Pap tests.  Hepatitis C blood test.** / For any individual with known risks for hepatitis C.  Skin self-exam. / Monthly.  Influenza immunization.** / Every year.  Pneumococcal polysaccharide immunization.** / 1 to 2 doses if you smoke cigarettes or if you have certain chronic medical conditions.  Tetanus, diphtheria, pertussis (Tdap, Td) immunization. / A one-time dose of Tdap vaccine. After that, you need a Td booster dose every 10 years.  HPV immunization. / 3 doses over 6 months, if you are 26 and younger.  Measles, mumps, rubella (MMR) immunization. / You need at least 1 dose of MMR if you were born in 1957 or later. You may also need a second dose.  Meningococcal immunization. / 1 dose if you are age 19 to 21 and a first-year college student living in a residence hall, or have one of several medical conditions, you need to get vaccinated against meningococcal disease. You may also need additional booster doses.  Varicella immunization.** / Consult your caregiver.  Hepatitis A immunization.** / Consult your caregiver. 2 doses, 6 to 18 months apart.  Hepatitis B immunization.** / Consult your caregiver. 3 doses usually over 6 months. Ages 40 to 64  Blood pressure check.** / Every 1 to 2 years.  Lipid and cholesterol check.** / Every 5 years beginning at age 20.  Clinical breast exam.** / Every year after age 40.  Mammogram.** / Every year beginning at age 40   and continuing for as long as you are in good health. Consult with your  caregiver.  Pap test.** / Every 3 years starting at age 30 through age 65 or 70 with a history of 3 consecutive normal Pap tests.  HPV screening.** / Every 3 years from ages 30 through ages 65 to 70 with a history of 3 consecutive normal Pap tests.  Fecal occult blood test (FOBT) of stool. / Every year beginning at age 50 and continuing until age 75. You may not need to do this test if you get a colonoscopy every 10 years.  Flexible sigmoidoscopy or colonoscopy.** / Every 5 years for a flexible sigmoidoscopy or every 10 years for a colonoscopy beginning at age 50 and continuing until age 75.  Hepatitis C blood test.** / For all people born from 1945 through 1965 and any individual with known risks for hepatitis C.  Skin self-exam. / Monthly.  Influenza immunization.** / Every year.  Pneumococcal polysaccharide immunization.** / 1 to 2 doses if you smoke cigarettes or if you have certain chronic medical conditions.  Tetanus, diphtheria, pertussis (Tdap, Td) immunization.** / A one-time dose of Tdap vaccine. After that, you need a Td booster dose every 10 years.  Measles, mumps, rubella (MMR) immunization. / You need at least 1 dose of MMR if you were born in 1957 or later. You may also need a second dose.  Varicella immunization.** / Consult your caregiver.  Meningococcal immunization.** / Consult your caregiver.  Hepatitis A immunization.** / Consult your caregiver. 2 doses, 6 to 18 months apart.  Hepatitis B immunization.** / Consult your caregiver. 3 doses, usually over 6 months. Ages 65 and over  Blood pressure check.** / Every 1 to 2 years.  Lipid and cholesterol check.** / Every 5 years beginning at age 20.  Clinical breast exam.** / Every year after age 40.  Mammogram.** / Every year beginning at age 40 and continuing for as long as you are in good health. Consult with your caregiver.  Pap test.** / Every 3 years starting at age 30 through age 65 or 70 with a 3  consecutive normal Pap tests. Testing can be stopped between 65 and 70 with 3 consecutive normal Pap tests and no abnormal Pap or HPV tests in the past 10 years.  HPV screening.** / Every 3 years from ages 30 through ages 65 or 70 with a history of 3 consecutive normal Pap tests. Testing can be stopped between 65 and 70 with 3 consecutive normal Pap tests and no abnormal Pap or HPV tests in the past 10 years.  Fecal occult blood test (FOBT) of stool. / Every year beginning at age 50 and continuing until age 75. You may not need to do this test if you get a colonoscopy every 10 years.  Flexible sigmoidoscopy or colonoscopy.** / Every 5 years for a flexible sigmoidoscopy or every 10 years for a colonoscopy beginning at age 50 and continuing until age 75.  Hepatitis C blood test.** / For all people born from 1945 through 1965 and any individual with known risks for hepatitis C.  Osteoporosis screening.** / A one-time screening for women ages 65 and over and women at risk for fractures or osteoporosis.  Skin self-exam. / Monthly.  Influenza immunization.** / Every year.  Pneumococcal polysaccharide immunization.** / 1 dose at age 65 (or older) if you have never been vaccinated.  Tetanus, diphtheria, pertussis (Tdap, Td) immunization. / A one-time dose of Tdap vaccine if you are over   65 and have contact with an infant, are a healthcare worker, or simply want to be protected from whooping cough. After that, you need a Td booster dose every 10 years.  Varicella immunization.** / Consult your caregiver.  Meningococcal immunization.** / Consult your caregiver.  Hepatitis A immunization.** / Consult your caregiver. 2 doses, 6 to 18 months apart.  Hepatitis B immunization.** / Check with your caregiver. 3 doses, usually over 6 months. ** Family history and personal history of risk and conditions may change your caregiver's recommendations. Document Released: 03/17/2001 Document Revised: 04/13/2011  Document Reviewed: 06/16/2010 [employer] Patient Information 2013 [employer].  

## 2012-04-04 NOTE — Progress Notes (Signed)
  Subjective:     April Daniels is a 37 y.o. G34P2002 female and is here for a comprehensive gynecologic physical exam. The patient reports no GYN symptoms.  Desires refill of OCPs.   History   Social History  . Marital Status: Married    Spouse Name: N/A    Number of Children: N/A  . Years of Education: N/A   Occupational History  . Not on file.   Social History Main Topics  . Smoking status: Former Smoker    Types: Cigarettes    Quit date: 08/14/2006  . Smokeless tobacco: Not on file  . Alcohol Use: No  . Drug Use: No  . Sexually Active: Yes -- Female partner(s)    Birth Control/ Protection: Pill, Coitus interruptus   Other Topics Concern  . Not on file   Social History Narrative  . No narrative on file   Health Maintenance  Topic Date Due  . Pap Smear  02/18/2014  . Tetanus/tdap  11/06/2020   The following portions of the patient's history were reviewed and updated as appropriate: allergies, current medications, past family history, past medical history, past social history, past surgical history and problem list.  Review of Systems Pertinent items are noted in HPI.   Objective:  BP 120/92  Pulse 90  Ht 5\' 3"  (1.6 m)  Wt 190 lb (86.183 kg)  BMI 33.67 kg/m2  LMP 03/21/2012  Breastfeeding? No GENERAL: Well-developed, well-nourished female in no acute distress.  HEENT: Normocephalic, atraumatic. Sclerae anicteric.  NECK: Supple. Normal thyroid.  LUNGS: Clear to auscultation bilaterally.  HEART: Regular rate and rhythm. BREASTS: Symmetric in size. No masses, skin changes, nipple drainage, or lymphadenopathy. ABDOMEN: Soft, nontender, nondistended. No organomegaly. PELVIC: Normal external female genitalia. Vagina is pink and rugated.  Normal discharge. Normal cervix contour. Pap smear obtained. Uterus is normal in size. No adnexal mass or tenderness.  EXTREMITIES: No cyanosis, clubbing, or edema, 2+ distal pulses.    Assessment:    Healthy female exam.    Plan:   Pap done, will follow up results and manage accordingly. OCPs refilled. Routine preventative health maintenance measures emphasized  See After Visit Summary for Counseling Recommendations

## 2012-04-11 ENCOUNTER — Encounter: Payer: Self-pay | Admitting: Family Medicine

## 2012-04-11 ENCOUNTER — Ambulatory Visit (INDEPENDENT_AMBULATORY_CARE_PROVIDER_SITE_OTHER): Payer: BC Managed Care – PPO | Admitting: Family Medicine

## 2012-04-11 VITALS — BP 100/70 | HR 80 | Temp 98.3°F | Ht 63.0 in | Wt 189.8 lb

## 2012-04-11 DIAGNOSIS — Z0000 Encounter for general adult medical examination without abnormal findings: Secondary | ICD-10-CM | POA: Insufficient documentation

## 2012-04-11 NOTE — Progress Notes (Signed)
Subjective:    Patient ID: April Daniels, female    DOB: 01/31/1976, 37 y.o.   MRN: [id number]  HPI Here for appt to get re established   Has been doing well  Moved back to [place] and works at the same place  Has 2 kids - aged 5 and 2 (girl and boy)   Pap 3/14- at [facility]  OC - tricyclic - doing well on that  Not planning more kids - not out of the relm    alllergies- doing ok - has to see Dr [place] about once per year   Asthma- not bad - sees allergist - is no longer on symbicort - (though may need it several times per year) Not using rescue inhaler very often - once per month   Vaccines - flu vaccine this fall She has not had a pneumonia vaccine- and unsure if she wants one - will speak to her allergist about that   Mood-has been good overall - no post partum problems, not a lot of stres currently, just very busy Stress - 2 kids and working   No new health problems  Her gyn does not do general care Has not had cholesterol or other labs  Was anemic after her last baby was born Lab Results  Component Value Date   WBC 10.4 11/07/2010   HGB 8.8* 11/07/2010   HCT 26.1* 11/07/2010   MCV 90.6 11/07/2010   PLT 156 11/07/2010     Patient Active Problem List  Diagnosis  . DERMATOPHYTOSIS OF NAIL  . ALLERGIC RHINITIS  . ASTHMA  . NONSPEC REACT TUBERCULIN SKN TEST W/O ACTV TB  . Previous cesarean delivery, delivered  . Routine general medical examination at a health care facility   Past Medical History  Diagnosis Date  . Allergy-induced asthma   . GERD (gastroesophageal reflux disease)     with pregnancy  . Asthma     allergy induced  . Mitral valve prolapse 1997   Past Surgical History  Procedure Laterality Date  . Appendectomy    . Cesarean section    . Cesarean section  11/06/2010    Procedure: CESAREAN SECTION;  Surgeon: Hollie Salk C. [name], MD;  Location: [facility];  Service: Gynecology;  Laterality: N/A;   History  Substance Use Topics  . Smoking  status: Former Smoker    Types: Cigarettes    Quit date: 08/14/2006  . Smokeless tobacco: Not on file  . Alcohol Use: No   Family History  Problem Relation Age of Onset  . Diabetes Father   . Hypertension Mother   . Hypertension Brother    Allergies  Allergen Reactions  . Sulfamethoxazole W-Trimethoprim     REACTION: GI upset   Current Outpatient Prescriptions on File Prior to Visit  Medication Sig Dispense Refill  . cetirizine (ZYRTEC) 10 MG tablet Take 10 mg by mouth daily.       . fluticasone (FLONASE) 50 MCG/ACT nasal spray Place 2 sprays into the nose daily.      . Norgestimate-Ethinyl Estradiol Triphasic (TRI-PREVIFEM) 0.18/0.215/0.25 MG-35 MCG tablet TAKE 1 TABLET BY MOUTH DAILY.  3 Package  5   No current facility-administered medications on file prior to visit.     Review of Systems Review of Systems  Constitutional: Negative for fever, appetite change, fatigue and unexpected weight change.  Eyes: Negative for pain and visual disturbance.  Respiratory: Negative for cough and shortness of breath.   Cardiovascular: Negative for cp or palpitations  Gastrointestinal: Negative for nausea, diarrhea and constipation.  Genitourinary: Negative for urgency and frequency.  Skin: Negative for pallor or rash   Neurological: Negative for weakness, light-headedness, numbness and headaches.  Hematological: Negative for adenopathy. Does not bruise/bleed easily.  Psychiatric/Behavioral: Negative for dysphoric mood. The patient is not nervous/anxious.         Objective:   Physical Exam  Constitutional: She appears well-developed and well-nourished. No distress.  obese and well appearing   HENT:  Head: Normocephalic and atraumatic.  Right Ear: External ear normal.  Left Ear: External ear normal.  Nose: Nose normal.  Mouth/Throat: Oropharynx is clear and moist.  Eyes: Conjunctivae and EOM are normal. Pupils are equal, round, and reactive to light. Right eye exhibits no  discharge. Left eye exhibits no discharge. No scleral icterus.  Neck: Normal range of motion. Neck supple. No JVD present. Carotid bruit is not present. No thyromegaly present.  Cardiovascular: Normal rate, regular rhythm, normal heart sounds and intact distal pulses.  Exam reveals no gallop.   Pulmonary/Chest: Effort normal and breath sounds normal. No respiratory distress. She has no wheezes.  Abdominal: Soft. Bowel sounds are normal. She exhibits no distension, no abdominal bruit and no mass. There is no tenderness.  Musculoskeletal: She exhibits no edema and no tenderness.  Lymphadenopathy:    She has no cervical adenopathy.  Neurological: She is alert. She has normal reflexes. No cranial nerve deficit. She exhibits normal muscle tone. Coordination normal.  Skin: Skin is warm and dry. No rash noted. No erythema. No pallor.  Psychiatric: She has a normal mood and affect.          Assessment & Plan:

## 2012-04-11 NOTE — Patient Instructions (Addendum)
Take care of yourself  Labs for wellness today  Aim for exercise 5 days per week Eat a healthy low cholesterol diet Avoid red meat/ fried foods/ egg yolks/ fatty breakfast meats/ butter, cheese and high fat dairy/ and shellfish

## 2012-04-11 NOTE — Assessment & Plan Note (Signed)
Reviewed health habits including diet and exercise and skin cancer prevention Also reviewed health mt list, fam hx and immunizations  No active problems today  Wellness labs today

## 2012-04-12 LAB — COMPREHENSIVE METABOLIC PANEL
ALT: 27 U/L (ref 0–35)
AST: 25 U/L (ref 0–37)
Albumin: 3.6 g/dL (ref 3.5–5.2)
Alkaline Phosphatase: 50 U/L (ref 39–117)
BUN: 11 mg/dL (ref 6–23)
CO2: 24 mEq/L (ref 19–32)
Calcium: 9 mg/dL (ref 8.4–10.5)
Chloride: 105 mEq/L (ref 96–112)
Creatinine, Ser: 0.8 mg/dL (ref 0.4–1.2)
GFR: 82.99 mL/min (ref >60.00)
Glucose, Bld: 86 mg/dL (ref 70–99)
Potassium: 4.4 mEq/L (ref 3.5–5.1)
Sodium: 136 mEq/L (ref 135–145)
Total Bilirubin: 0.6 mg/dL (ref 0.3–1.2)
Total Protein: 7 g/dL (ref 6.0–8.3)

## 2012-04-12 LAB — LIPID PANEL
Cholesterol: 152 mg/dL (ref 0–200)
HDL: 53.4 mg/dL (ref >39.00)
LDL Cholesterol: 76 mg/dL (ref 0–99)
Total CHOL/HDL Ratio: 3
Triglycerides: 113 mg/dL (ref 0.0–149.0)
VLDL: 22.6 mg/dL (ref 0.0–40.0)

## 2012-04-12 LAB — CBC WITH DIFFERENTIAL/PLATELET
Basophils Absolute: 0 10*3/uL (ref 0.0–0.1)
Basophils Relative: 0.3 % (ref 0.0–3.0)
Eosinophils Absolute: 0.2 10*3/uL (ref 0.0–0.7)
Eosinophils Relative: 4.4 % (ref 0.0–5.0)
HCT: 37.9 % (ref 36.0–46.0)
Hemoglobin: 13 g/dL (ref 12.0–15.0)
Lymphocytes Relative: 43.8 % (ref 12.0–46.0)
Lymphs Abs: 2.4 10*3/uL (ref 0.7–4.0)
MCHC: 34.2 g/dL (ref 30.0–36.0)
MCV: 86.9 fl (ref 78.0–100.0)
Monocytes Absolute: 0.6 10*3/uL (ref 0.1–1.0)
Monocytes Relative: 10.3 % (ref 3.0–12.0)
Neutro Abs: 2.3 10*3/uL (ref 1.4–7.7)
Neutrophils Relative %: 41.2 % — ABNORMAL LOW (ref 43.0–77.0)
Platelets: 210 10*3/uL (ref 150.0–400.0)
RBC: 4.36 Mil/uL (ref 3.87–5.11)
RDW: 12.2 % (ref 11.5–14.6)
WBC: 5.5 10*3/uL (ref 4.5–10.5)

## 2012-04-12 LAB — TSH: TSH: 0.81 u[IU]/mL (ref 0.35–5.50)

## 2012-04-13 ENCOUNTER — Encounter: Payer: Self-pay | Admitting: *Deleted

## 2013-03-03 IMAGING — US US OB FOLLOW-UP
1 series · 14 of 28 positions shown · non-contrast
Comparison: none

[Series 1: us ob follow-up · 0.18mm/px · 14 of 94 slices shown]
[im 4/94]
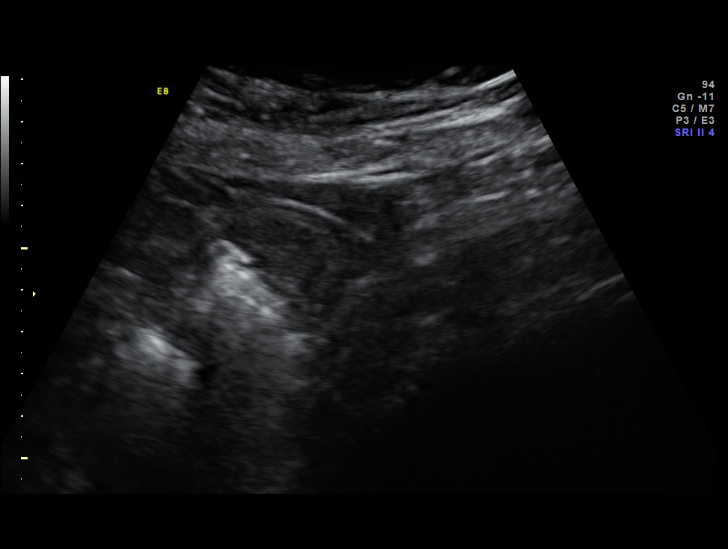
[im 11/94]
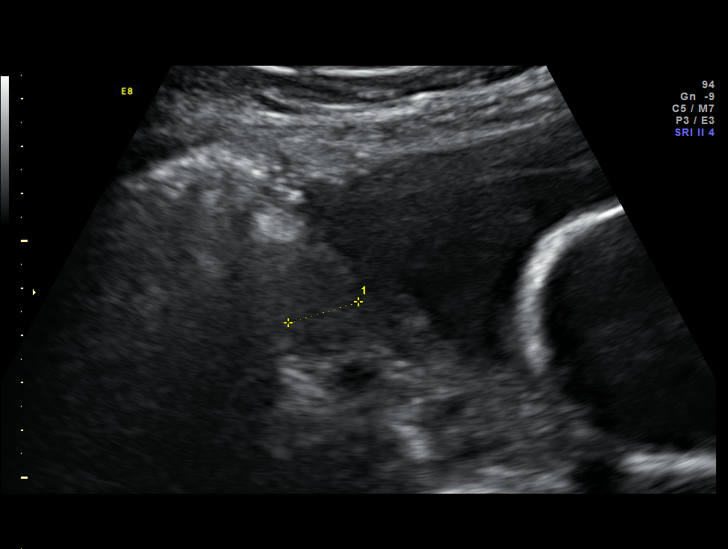
[im 18/94]
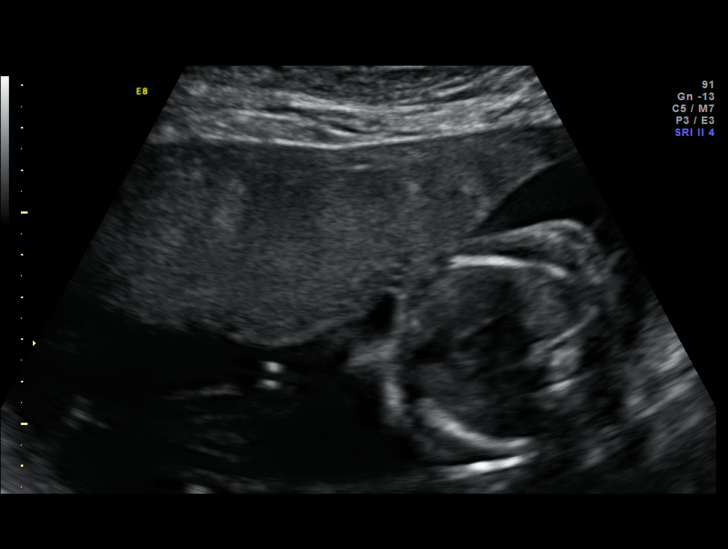
[im 25/94]
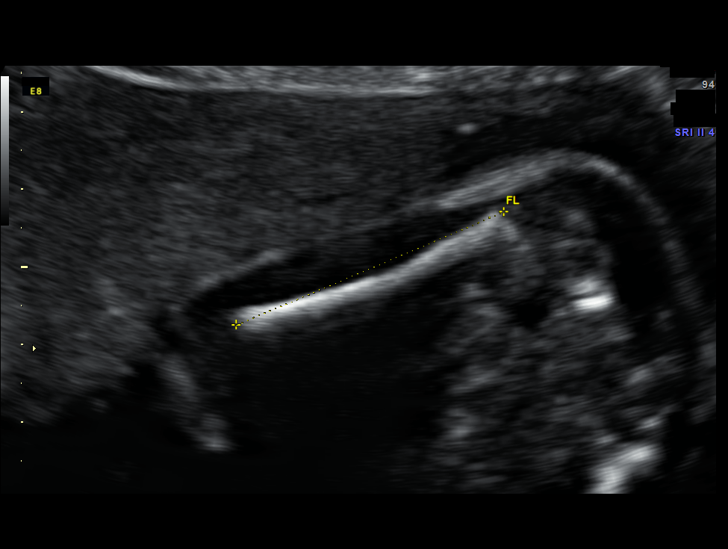
[im 32/94]
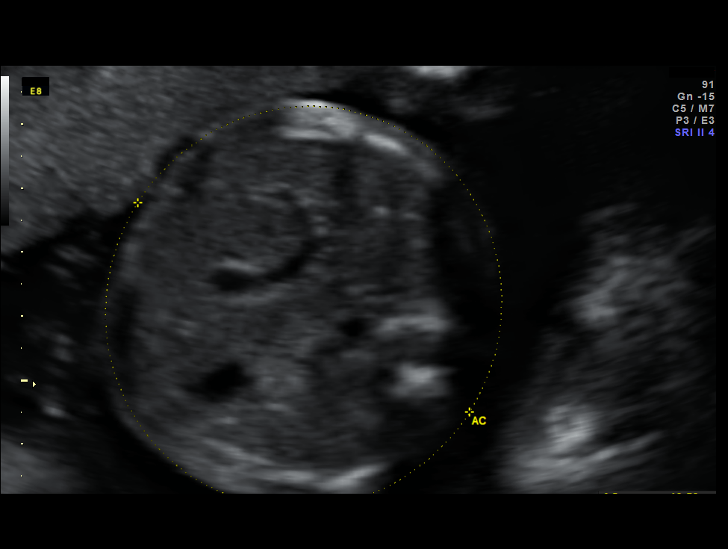
[im 38/94]
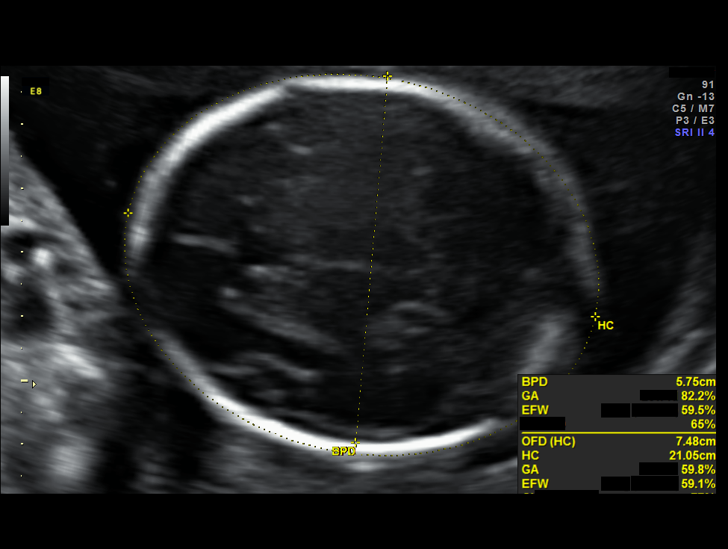
[im 45/94]
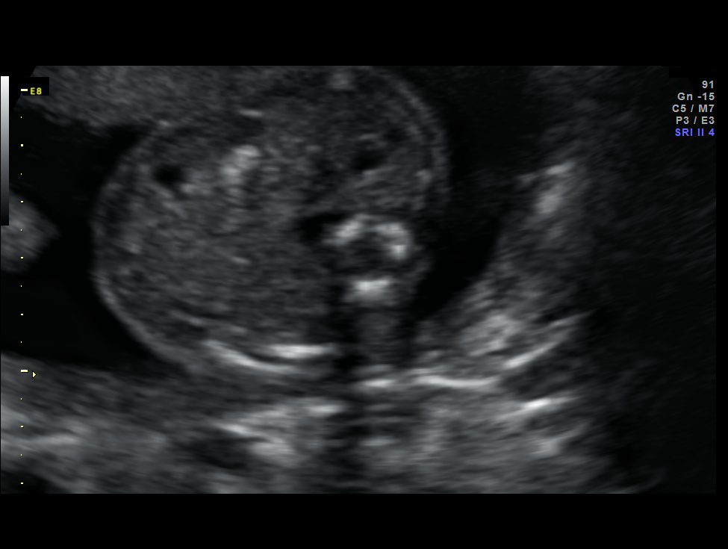
[im 52/94]
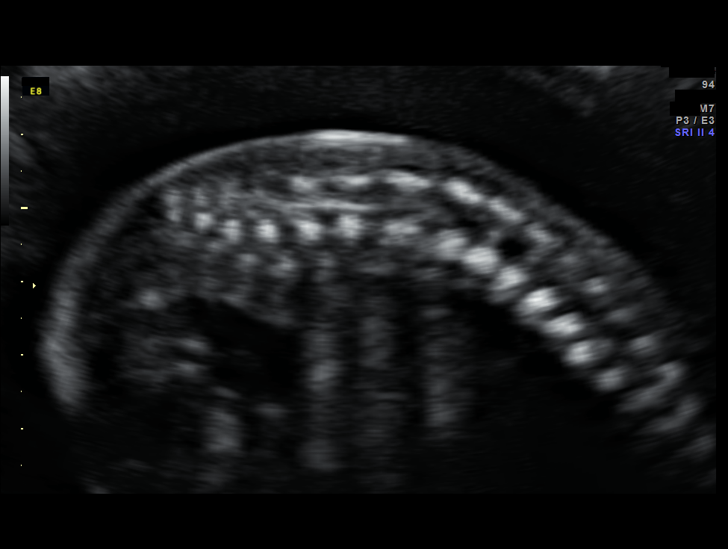
[im 59/94]
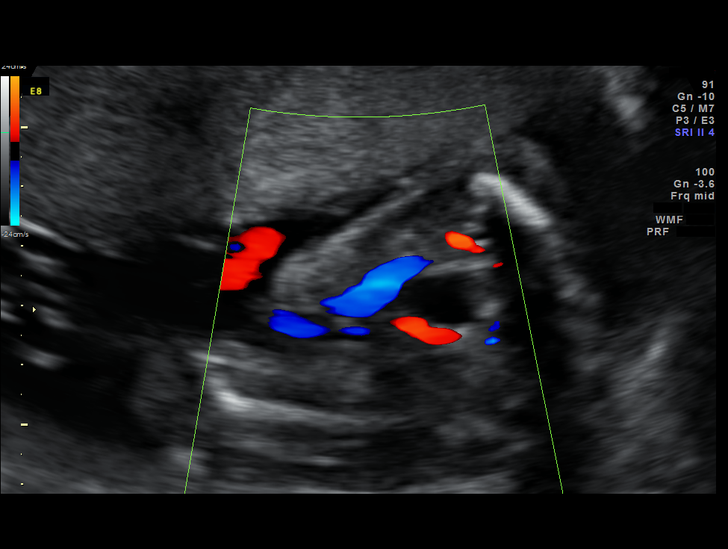
[im 66/94]
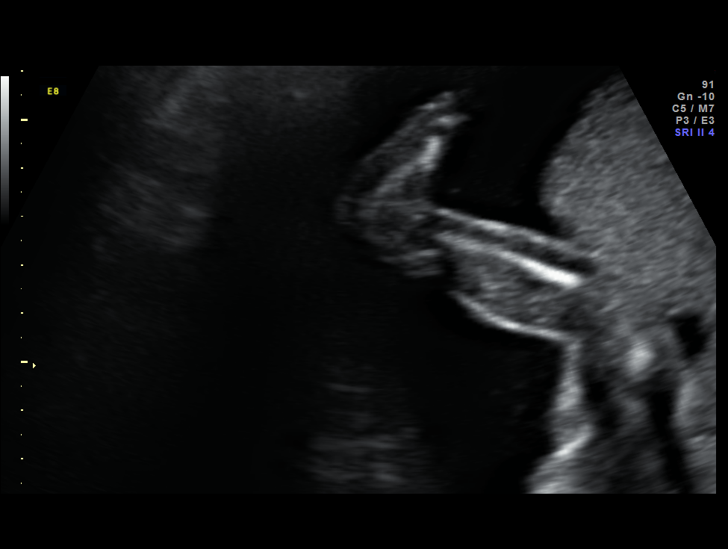
[im 73/94]
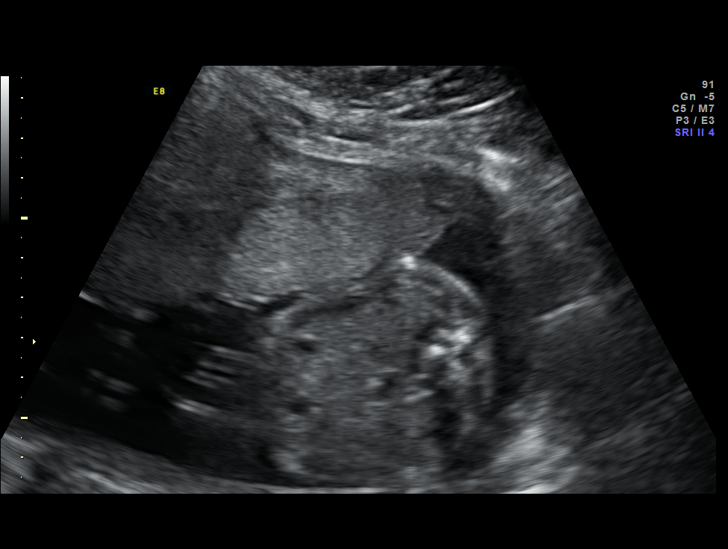
[im 80/94]
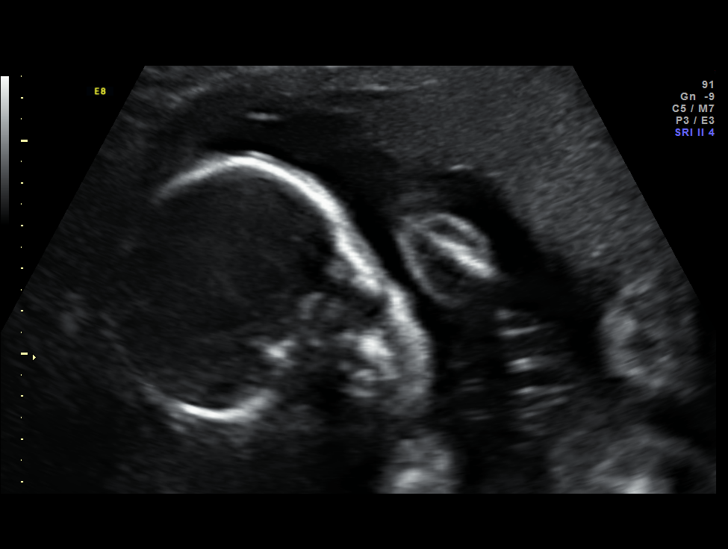
[im 87/94]
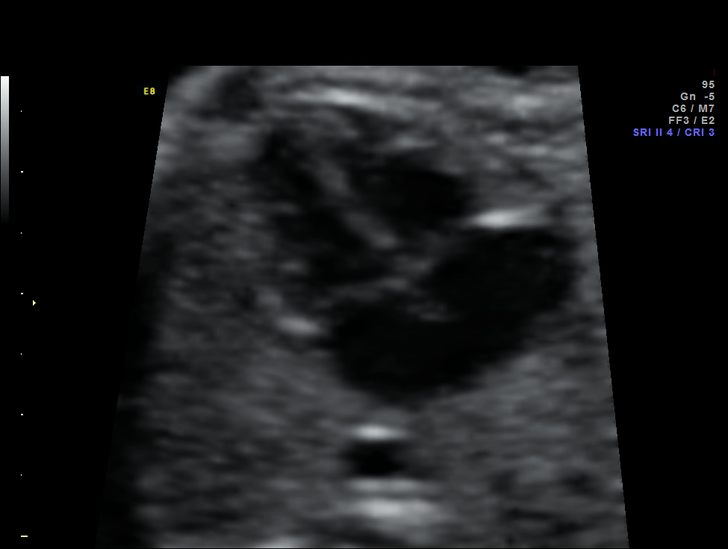
[im 94/94]
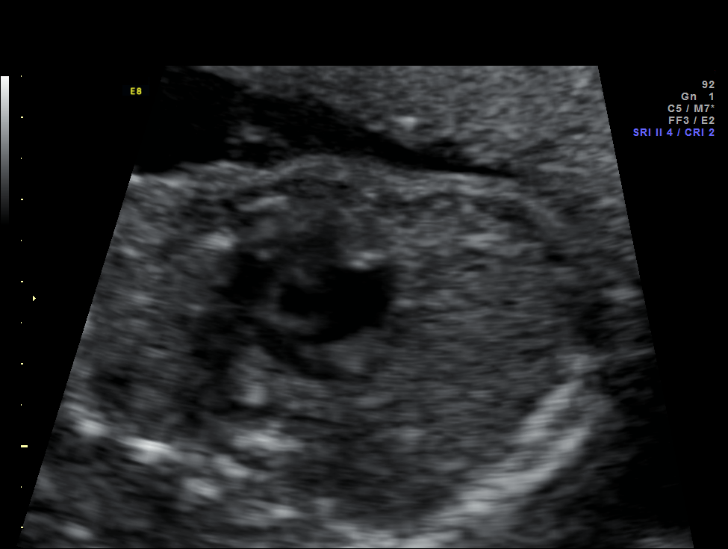

[14 of 28 positions shown; findings below may reference images not displayed]

Canned report from images found in remote index.

Refer to host system for actual result text.

## 2013-03-14 ENCOUNTER — Encounter: Payer: Self-pay | Admitting: Family Medicine

## 2013-03-14 ENCOUNTER — Ambulatory Visit (INDEPENDENT_AMBULATORY_CARE_PROVIDER_SITE_OTHER): Payer: BC Managed Care – PPO | Admitting: Family Medicine

## 2013-03-14 VITALS — BP 144/86 | HR 83 | Temp 98.4°F | Ht 63.0 in | Wt 189.5 lb

## 2013-03-14 DIAGNOSIS — R030 Elevated blood-pressure reading, without diagnosis of hypertension: Secondary | ICD-10-CM | POA: Insufficient documentation

## 2013-03-14 DIAGNOSIS — IMO0001 Reserved for inherently not codable concepts without codable children: Secondary | ICD-10-CM

## 2013-03-14 NOTE — Patient Instructions (Signed)
Stop your oral contraceptive now (use condoms to prevent pregnancy)  Also work up to 30 minutes of exercise 5 days per week and try to loose weight  Low sodium healthy diet - DASH   Follow up with me in 2-3 weeks - we will see how bp is and make a plan   DASH Diet The DASH diet stands for "Dietary Approaches to Stop Hypertension." It is a healthy eating plan that has been shown to reduce high blood pressure (hypertension) in as little as 14 days, while also possibly providing other significant health benefits. These other health benefits include reducing the risk of breast cancer after menopause and reducing the risk of type 2 diabetes, heart disease, colon cancer, and stroke. Health benefits also include weight loss and slowing kidney failure in patients with chronic kidney disease.  DIET GUIDELINES  Limit salt (sodium). Your diet should contain less than 1500 mg of sodium daily.  Limit refined or processed carbohydrates. Your diet should include mostly whole grains. Desserts and added sugars should be used sparingly.  Include small amounts of heart-healthy fats. These types of fats include nuts, oils, and tub margarine. Limit saturated and trans fats. These fats have been shown to be harmful in the body. CHOOSING FOODS  The following food groups are based on a 2000 calorie diet. See your Registered Dietitian for individual calorie needs. Grains and Grain Products (6 to 8 servings daily)  Eat More Often: Whole-wheat bread, brown rice, whole-grain or wheat pasta, quinoa, popcorn without added fat or salt (air popped).  Eat Less Often: White bread, white pasta, white rice, cornbread. Vegetables (4 to 5 servings daily)  Eat More Often: Fresh, frozen, and canned vegetables. Vegetables may be raw, steamed, roasted, or grilled with a minimal amount of fat.  Eat Less Often/Avoid: Creamed or fried vegetables. Vegetables in a cheese sauce. Fruit (4 to 5 servings daily)  Eat More Often: All  fresh, canned (in natural juice), or frozen fruits. Dried fruits without added sugar. One hundred percent fruit juice ( cup [237 mL] daily).  Eat Less Often: Dried fruits with added sugar. Canned fruit in light or heavy syrup. Foot LockerLean Meats, Fish, and Poultry (2 servings or less daily. One serving is 3 to 4 oz [85-114 g]).  Eat More Often: Ninety percent or leaner ground beef, tenderloin, sirloin. Round cuts of beef, chicken breast, Mal[place]key breast. All fish. Grill, bake, or broil your meat. Nothing should be fried.  Eat Less Often/Avoid: Fatty cuts of meat, [place]key, or chicken leg, thigh, or wing. Fried cuts of meat or fish. Dairy (2 to 3 servings)  Eat More Often: Low-fat or fat-free milk, low-fat plain or light yogurt, reduced-fat or part-skim cheese.  Eat Less Often/Avoid: Milk (whole, 2%).Whole milk yogurt. Full-fat cheeses. Nuts, Seeds, and Legumes (4 to 5 servings per week)  Eat More Often: All without added salt.  Eat Less Often/Avoid: Salted nuts and seeds, canned beans with added salt. Fats and Sweets (limited)  Eat More Often: Vegetable oils, tub margarines without trans fats, sugar-free gelatin. Mayonnaise and salad dressings.  Eat Less Often/Avoid: Coconut oils, palm oils, butter, stick margarine, cream, half and half, cookies, candy, pie. FOR MORE INFORMATION The Dash Diet Eating Plan: www.dashdiet.org Document Released: 01/08/2011 Document Revised: 04/13/2011 Document Reviewed: 01/08/2011 [facility] Patient Information 2014 [place]Care, [place].

## 2013-03-14 NOTE — Progress Notes (Signed)
Subjective:    Patient ID: April Daniels, female    DOB: 12/30/1975, 38 y.o.   MRN: [id number]  HPI Here to discuss blood pressure Her family urged her to come   On Sat - she went to donate blood  Was declined due to elevated bp    (she was a little stressed at the time) 171/110 highest  She checked bp on her brother's monitor-on Sunday and it was improved 138/90 - several times   Had some floaters yesterday and a bit of a ha  157/94 at that time   HTN runs on mother's side of the family   She is on OC -for years / not new  She did get some inc bp with pregnancy  - once had pre eclampsia    BP Readings from Last 3 Encounters:  03/14/13 144/86  04/11/12 100/70  04/04/12 120/92    Has a new job  Stress level is higher than usual  2 small children  She eats pretty healthy  -- she does eat processed foods   Wt is stable - bmi of 33   Patient Active Problem List   Diagnosis Date Noted  . Routine general medical examination at a health care facility 04/11/2012  . Previous cesarean delivery, delivered 08/14/2010  . NONSPEC REACT TUBERCULIN SKN TEST W/O ACTV TB 03/15/2008  . DERMATOPHYTOSIS OF NAIL 11/30/2007  . ALLERGIC RHINITIS 01/25/2007  . ASTHMA 01/25/2007   Past Medical History  Diagnosis Date  . Allergy-induced asthma   . GERD (gastroesophageal reflux disease)     with pregnancy  . Asthma     allergy induced  . Mitral valve prolapse 1997   Past Surgical History  Procedure Laterality Date  . Appendectomy    . Cesarean section    . Cesarean section  11/06/2010    Procedure: CESAREAN SECTION;  Surgeon: Hollie SalkMyra C. Marice Potterove, MD;  Location: [facility];  Service: Gynecology;  Laterality: N/A;   History  Substance Use Topics  . Smoking status: Former Smoker    Types: Cigarettes    Quit date: 08/14/2006  . Smokeless tobacco: Not on file  . Alcohol Use: No   Family History  Problem Relation Age of Onset  . Diabetes Father   . Hypertension Mother   .  Hypertension Brother    Allergies  Allergen Reactions  . Sulfamethoxazole-Trimethoprim     REACTION: GI upset   Current Outpatient Prescriptions on File Prior to Visit  Medication Sig Dispense Refill  . cetirizine (ZYRTEC) 10 MG tablet Take 10 mg by mouth daily.       . Norgestimate-Ethinyl Estradiol Triphasic (TRI-PREVIFEM) 0.18/0.215/0.25 MG-35 MCG tablet TAKE 1 TABLET BY MOUTH DAILY.  3 Package  5  . fluticasone (FLONASE) 50 MCG/ACT nasal spray Place 2 sprays into the nose daily.       No current facility-administered medications on file prior to visit.     Review of Systems    Review of Systems  Constitutional: Negative for fever, appetite change, fatigue and unexpected weight change.  Eyes: Negative for pain and visual disturbance.  Respiratory: Negative for cough and shortness of breath.   Cardiovascular: Negative for cp or palpitations    Gastrointestinal: Negative for nausea, diarrhea and constipation.  Genitourinary: Negative for urgency and frequency.  Skin: Negative for pallor or rash   Neurological: Negative for weakness, light-headedness, numbness and pos for  headaches.  Hematological: Negative for adenopathy. Does not bruise/bleed easily.  Psychiatric/Behavioral: Negative for dysphoric mood.  The patient is not nervous/anxious.      Objective:   Physical Exam  Constitutional: She appears well-developed and well-nourished. No distress.  obese and well appearing   HENT:  Head: Normocephalic and atraumatic.  Right Ear: External ear normal.  Left Ear: External ear normal.  Nose: Nose normal.  Mouth/Throat: Oropharynx is clear and moist.  Eyes: Conjunctivae and EOM are normal. Pupils are equal, round, and reactive to light. Right eye exhibits no discharge. Left eye exhibits no discharge. No scleral icterus.  Neck: Normal range of motion. Neck supple. No JVD present. No thyromegaly present.  Cardiovascular: Normal rate, regular rhythm, normal heart sounds and intact  distal pulses.  Exam reveals no gallop.   Pulmonary/Chest: Effort normal and breath sounds normal. No respiratory distress. She has no wheezes. She has no rales.  Abdominal: Soft. Bowel sounds are normal. She exhibits no distension and no mass. There is no tenderness.  Musculoskeletal: She exhibits no edema and no tenderness.  Lymphadenopathy:    She has no cervical adenopathy.  Neurological: She is alert. She has normal reflexes. No cranial nerve deficit. She exhibits normal muscle tone. Coordination normal.  Skin: Skin is warm and dry. No rash noted. No erythema. No pallor.  Psychiatric: She has a normal mood and affect.  Not seemingly anxious           Assessment & Plan:

## 2013-03-14 NOTE — Progress Notes (Signed)
Pre-visit discussion using our clinic review tool. No additional management support is needed unless otherwise documented below in the visit note.  

## 2013-03-15 NOTE — Assessment & Plan Note (Signed)
?   If early HTN Rev lifestyle change / diet and exercise -copy of dash diet  Will hold OC for now  F/u and if still elevated will disc tx

## 2013-04-07 ENCOUNTER — Encounter: Payer: Self-pay | Admitting: Family Medicine

## 2013-04-07 ENCOUNTER — Ambulatory Visit (INDEPENDENT_AMBULATORY_CARE_PROVIDER_SITE_OTHER): Payer: BC Managed Care – PPO | Admitting: Family Medicine

## 2013-04-07 VITALS — BP 132/84 | HR 79 | Temp 98.5°F | Ht 63.0 in | Wt 188.5 lb

## 2013-04-07 DIAGNOSIS — IMO0001 Reserved for inherently not codable concepts without codable children: Secondary | ICD-10-CM

## 2013-04-07 DIAGNOSIS — R030 Elevated blood-pressure reading, without diagnosis of hypertension: Secondary | ICD-10-CM

## 2013-04-07 NOTE — Progress Notes (Signed)
Subjective:    Patient ID: April Daniels, female    DOB: 02-18-1975, 38 y.o.   MRN: [id number]  HPI Here for f/u of elevated bp   Last visit disc lifestyle change and also stopped her OC   BP Readings from Last 3 Encounters:  04/07/13 132/84  03/14/13 144/86  04/11/12 100/70    Wt is down 1 lb with bmi of 33   She is active but no additional exercise  She is eating better overall -cooking more at home and using less processed foods    She went to allergist and bp was 117/80  Overall bp is better   She is having allergy troubles - thinks she will start back on allergy shots   Unsure what she is going to do about contraception   Patient Active Problem List   Diagnosis Date Noted  . Elevated BP 03/14/2013  . Routine general medical examination at a health care facility 04/11/2012  . Previous cesarean delivery, delivered 08/14/2010  . NONSPEC REACT TUBERCULIN SKN TEST W/O ACTV TB 03/15/2008  . DERMATOPHYTOSIS OF NAIL 11/30/2007  . ALLERGIC RHINITIS 01/25/2007  . ASTHMA 01/25/2007   Past Medical History  Diagnosis Date  . Allergy-induced asthma   . GERD (gastroesophageal reflux disease)     with pregnancy  . Asthma     allergy induced  . Mitral valve prolapse 1997   Past Surgical History  Procedure Laterality Date  . Appendectomy    . Cesarean section    . Cesarean section  11/06/2010    Procedure: CESAREAN SECTION;  Surgeon: Hollie Salk C. [name], MD;  Location: [facility];  Service: Gynecology;  Laterality: N/A;   History  Substance Use Topics  . Smoking status: Former Smoker    Types: Cigarettes    Quit date: 08/14/2006  . Smokeless tobacco: Not on file  . Alcohol Use: No   Family History  Problem Relation Age of Onset  . Diabetes Father   . Hypertension Mother   . Hypertension Brother    Allergies  Allergen Reactions  . Ortho Tri-Cyclen [Norgestimate-Eth Estradiol]     Raised blood pressure  . Sulfamethoxazole-Trimethoprim     REACTION: GI upset    Current Outpatient Prescriptions on File Prior to Visit  Medication Sig Dispense Refill  . fluticasone (FLONASE) 50 MCG/ACT nasal spray Place 2 sprays into the nose daily.       No current facility-administered medications on file prior to visit.    Review of Systems Review of Systems  Constitutional: Negative for fever, appetite change, fatigue and unexpected weight change.  Eyes: Negative for pain and visual disturbance.  Respiratory: Negative for cough and shortness of breath.   Cardiovascular: Negative for cp or palpitations    Gastrointestinal: Negative for nausea, diarrhea and constipation.  Genitourinary: Negative for urgency and frequency.  Skin: Negative for pallor or rash   Neurological: Negative for weakness, light-headedness, numbness and headaches.  Hematological: Negative for adenopathy. Does not bruise/bleed easily.  Psychiatric/Behavioral: Negative for dysphoric mood. The patient is not nervous/anxious.         Objective:   Physical Exam  Constitutional: She appears well-developed and well-nourished. No distress.  obese and well appearing   HENT:  Head: Normocephalic and atraumatic.  Mouth/Throat: Oropharynx is clear and moist.  Nares are boggy and congested   Eyes: Conjunctivae and EOM are normal. No scleral icterus.  Neck: Normal range of motion. Neck supple.  Cardiovascular: Normal rate, regular rhythm, normal heart sounds and  intact distal pulses.  Exam reveals no gallop.   No murmur heard. Pulmonary/Chest: Effort normal and breath sounds normal. No respiratory distress. She has no wheezes. She has no rales.  No crackles   Musculoskeletal: She exhibits no edema.  Lymphadenopathy:    She has no cervical adenopathy.  Neurological: She is alert. She has normal reflexes.  Skin: Skin is warm and dry. No rash noted. No erythema. No pallor.  Psychiatric: She has a normal mood and affect.          Assessment & Plan:

## 2013-04-07 NOTE — Patient Instructions (Signed)
Your blood pressure is better today Take care of yourself  Avoid sodium/ work on healthy diet and exercise  We will continue to watch this

## 2013-04-07 NOTE — Assessment & Plan Note (Signed)
Improved off her OC Rev fam hx and lifestyle habits BP: 132/84 mmHg  Will work harder on wt loss/ DASH diet and starting exercise program We will continue to monitor this

## 2013-04-07 NOTE — Progress Notes (Signed)
Pre visit review using our clinic review tool, if applicable. No additional management support is needed unless otherwise documented below in the visit note. 

## 2013-04-11 ENCOUNTER — Other Ambulatory Visit: Payer: Self-pay | Admitting: Obstetrics & Gynecology

## 2013-06-13 ENCOUNTER — Encounter: Payer: Self-pay | Admitting: Obstetrics & Gynecology

## 2013-06-13 ENCOUNTER — Ambulatory Visit (INDEPENDENT_AMBULATORY_CARE_PROVIDER_SITE_OTHER): Payer: BC Managed Care – PPO | Admitting: Obstetrics & Gynecology

## 2013-06-13 VITALS — BP 116/80 | HR 87 | Ht 63.0 in | Wt 183.0 lb

## 2013-06-13 DIAGNOSIS — Z1151 Encounter for screening for human papillomavirus (HPV): Secondary | ICD-10-CM

## 2013-06-13 DIAGNOSIS — Z01419 Encounter for gynecological examination (general) (routine) without abnormal findings: Secondary | ICD-10-CM

## 2013-06-13 DIAGNOSIS — Z124 Encounter for screening for malignant neoplasm of cervix: Secondary | ICD-10-CM

## 2013-06-13 NOTE — Patient Instructions (Addendum)
Thank you for enrolling in MyChart. Please follow the instructions below to securely access your online medical record. MyChart allows you to send messages to your doctor, view your test results, manage appointments, and more.   How Do I Sign Up? 1. In your Internet browser, go to [employer] Address Bar and enter [email or web address]. 2. Click on the Sign Up Now link in the Sign In box. You will see the New Member Sign Up page. 3. Enter your MyChart Access Code exactly as it appears below. You will not need to use this code after you've completed the sign-up process. If you do not sign up before the expiration date, you must request a new code.  MyChart Access Code: [email or web address] Expires: 08/12/2013  2:03 PM  4. Enter your Social Security Number ([id number]) and Date of Birth (mm/dd/yyyy) as indicated and click Submit. You will be taken to the next sign-up page. 5. Create a MyChart ID. This will be your MyChart login ID and cannot be changed, so think of one that is secure and easy to remember. 6. Create a MyChart password. You can change your password at any time. 7. Enter your Password Reset Question and Answer. This can be used at a later time if you forget your password.  8. Enter your e-mail address. You will receive e-mail notification when new information is available in MyChart. 9. Click Sign Up. You can now view your medical record.   Additional Information Remember, MyChart is NOT to be used for urgent needs. For medical emergencies, dial 911.  Intrauterine Device Information An intrauterine device (IUD) is inserted into your uterus to prevent pregnancy. There are two types of IUDs available:   Copper IUD/Paragard This type of IUD is wrapped in copper wire and is placed inside the uterus. Copper makes the uterus and fallopian tubes produce a fluid that kills sperm. The copper IUD can stay in place for 10 years.  Hormone IUD This type of IUD contains the hormone progestin  (synthetic progesterone). The hormone thickens the cervical mucus and prevents sperm from entering the uterus. It also thins the uterine lining to prevent implantation of a fertilized egg. The hormone can weaken or kill the sperm that get into the uterus. One type of hormone IUD can stay in place for 5 years, and another type can stay in place for 3 years. Your health care provider will make sure you are a good candidate for a contraceptive IUD. Discuss with your health care provider the possible side effects.  ADVANTAGES OF AN INTRAUTERINE DEVICE  IUDs are highly effective, reversible, long acting, and low maintenance.   There are no estrogen-related side effects.   An IUD can be used when breastfeeding.   IUDs are not associated with weight gain.   The copper IUD works immediately after insertion.   The hormone IUD works right away if inserted within 7 days of your period starting. You will need to use a backup method of birth control for 7 days if the hormone IUD is inserted at any other time in your cycle.  The copper IUD does not interfere with your female hormones.   The hormone IUD can make heavy menstrual periods lighter and decrease cramping.   The hormone IUD can be used for 3 or 5 years.   The copper IUD can be used for 10 years. DISADVANTAGES OF AN INTRAUTERINE DEVICE  The hormone IUD can be associated with irregular bleeding patterns.   The copper IUD can make your menstrual  flow heavier and more painful.   You may experience cramping and vaginal bleeding after insertion.  Document Released: 12/24/2003 Document Revised: 09/21/2012 Document Reviewed: 07/10/2012 [facility] Patient Information 2014 [place]Care, [place]LLC.

## 2013-06-13 NOTE — Progress Notes (Signed)
    GYNECOLOGY CLINIC ANNUAL PREVENTATIVE CARE ENCOUNTER NOTE  Subjective:     April Daniels is a 38 y.o. [id number] female here for a routine annual gynecologic exam.  Current complaints: none.   Gynecologic History Patient's last menstrual period was 05/29/2013. Contraception: none, OCPs caused increased BP, will discuss vasectomy with husband vs Paragard Last Pap: 04/04/12. Results were: normal pap smear and negative HRHPV  Obstetric History OB History  Gravida Para Term Preterm AB SAB TAB Ectopic Multiple Living  2 2 2   0  0 0 0 2    # Outcome Date GA Lbr Len/2nd Weight Sex Delivery Anes PTL Lv  2 TRM 11/06/10 [id number]  8 lb 8.5 oz (3.87 kg) M LTCS Spinal  Y     Comments: true knot in cord; no dysmorphic features  1 TRM              The following portions of the patient's history were reviewed and updated as appropriate: allergies, current medications, past family history, past medical history, past social history, past surgical history and problem list.  Review of Systems A comprehensive review of systems was negative.    Objective:   BP 116/80  Pulse 87  Ht 5\' 3"  (1.6 m)  Wt 183 lb (83.008 kg)  BMI 32.43 kg/m2  LMP 05/29/2013 GENERAL: Well-developed, well-nourished female in no acute distress.  HEENT: Normocephalic, atraumatic. Sclerae anicteric.  NECK: Supple. Normal thyroid.  LUNGS: Clear to auscultation bilaterally.  HEART: Regular rate and rhythm. BREASTS: Symmetric in size. No masses, skin changes, nipple drainage, or lymphadenopathy. ABDOMEN: Soft, nontender, nondistended. No organomegaly. PELVIC: Normal external female genitalia. Vagina is pink and rugated.  Normal discharge. Normal cervix contour. Pap smear obtained. Uterus is normal in size. No adnexal mass or tenderness.  EXTREMITIES: No cyanosis, clubbing, or edema, 2+ distal pulses.   Assessment:   Annual gynecologic examination Contraception counseling   Plan:   Pap done, will follow up results  and manage accordingly. She will let us know which modality she will choose, information given Routine preventative health maintenance measures emphasized   Jaynie CollinsUGONNA  Harlo Fabela, MD, FACOG Attending Obstetrician & Gynecologist Faculty Practice, [facility]

## 2013-12-04 ENCOUNTER — Encounter: Payer: Self-pay | Admitting: Obstetrics & Gynecology

## 2015-10-04 ENCOUNTER — Ambulatory Visit (INDEPENDENT_AMBULATORY_CARE_PROVIDER_SITE_OTHER): Payer: BLUE CROSS/BLUE SHIELD | Admitting: Family Medicine

## 2015-10-04 ENCOUNTER — Encounter: Payer: Self-pay | Admitting: Family Medicine

## 2015-10-04 DIAGNOSIS — M722 Plantar fascial fibromatosis: Secondary | ICD-10-CM | POA: Diagnosis not present

## 2015-10-04 NOTE — Progress Notes (Signed)
Pre visit review using our clinic review tool, if applicable. No additional management support is needed unless otherwise documented below in the visit note. 

## 2015-10-04 NOTE — Progress Notes (Signed)
L heel pain.  On/off pain for the last few weeks.  No trauma.  Some days worse than others.  Gait altered from pain.  No R sided pain.  Plantar heel pain.  Pain with 1st step, some better with more walking.  H/o plantar fasciitis.  Usually wears sandals.  Feels better in tennis shoes.    Meds, vitals, and allergies reviewed.   ROS: Per HPI unless specifically indicated in ROS section  Left foot with normal inspection, normal dorsalis pedis pulse. Not tender over the medial or lateral malleolus. Midfoot and toes are not tender. The only place where she is tender is on the plantar aspect of the foot on the origin of the plantar fascia. Achilles not tender. Able to bear weight.

## 2015-10-04 NOTE — Patient Instructions (Signed)
Stretch your foot before getting up.   Ice your foot, take ibuprofen with food as needed, and get soft arch supports.  If not better soon, then call in about getting an appointment with Dr. Patsy Lageropland.  Take care.  Glad to see you.

## 2015-10-07 DIAGNOSIS — M722 Plantar fascial fibromatosis: Secondary | ICD-10-CM | POA: Insufficient documentation

## 2015-10-07 NOTE — Assessment & Plan Note (Signed)
D/w pt to: Stretch foot before getting up.   Ice foot, take ibuprofen with food as needed, and get soft arch supports.  If not better soon, then call in about getting an appointment with Dr. Patsy Lageropland.  She agrees.  F/u prn.

## 2015-10-22 ENCOUNTER — Ambulatory Visit (INDEPENDENT_AMBULATORY_CARE_PROVIDER_SITE_OTHER): Payer: BLUE CROSS/BLUE SHIELD | Admitting: Family Medicine

## 2015-10-22 ENCOUNTER — Encounter: Payer: Self-pay | Admitting: *Deleted

## 2015-10-22 ENCOUNTER — Encounter: Payer: Self-pay | Admitting: Family Medicine

## 2015-10-22 VITALS — BP 118/70 | HR 99 | Ht 63.0 in | Wt 192.0 lb

## 2015-10-22 DIAGNOSIS — Z01419 Encounter for gynecological examination (general) (routine) without abnormal findings: Secondary | ICD-10-CM

## 2015-10-22 DIAGNOSIS — Z124 Encounter for screening for malignant neoplasm of cervix: Secondary | ICD-10-CM

## 2015-10-22 DIAGNOSIS — Z1151 Encounter for screening for human papillomavirus (HPV): Secondary | ICD-10-CM | POA: Diagnosis not present

## 2015-10-22 LAB — COMPREHENSIVE METABOLIC PANEL
ALT: 12 U/L (ref 6–29)
AST: 16 U/L (ref 10–30)
Albumin: 4.2 g/dL (ref 3.6–5.1)
Alkaline Phosphatase: 67 U/L (ref 33–115)
BUN: 6 mg/dL — ABNORMAL LOW (ref 7–25)
CO2: 26 mmol/L (ref 20–31)
Calcium: 9 mg/dL (ref 8.6–10.2)
Chloride: 104 mmol/L (ref 98–110)
Creat: 0.77 mg/dL (ref 0.50–1.10)
Glucose, Bld: 84 mg/dL (ref 65–99)
Potassium: 4.2 mmol/L (ref 3.5–5.3)
Sodium: 137 mmol/L (ref 135–146)
Total Bilirubin: 0.3 mg/dL (ref 0.2–1.2)
Total Protein: 7 g/dL (ref 6.1–8.1)

## 2015-10-22 LAB — LIPID PANEL
Cholesterol: 158 mg/dL (ref 125–200)
HDL: 53 mg/dL
LDL Cholesterol: 74 mg/dL
Total CHOL/HDL Ratio: 3 ratio
Triglycerides: 155 mg/dL — ABNORMAL HIGH
VLDL: 31 mg/dL — ABNORMAL HIGH

## 2015-10-22 LAB — CBC
HCT: 34.3 % — ABNORMAL LOW (ref 35.0–45.0)
Hemoglobin: 10.8 g/dL — ABNORMAL LOW (ref 11.7–15.5)
MCH: 24.9 pg — ABNORMAL LOW (ref 27.0–33.0)
MCHC: 31.5 g/dL — ABNORMAL LOW (ref 32.0–36.0)
MCV: 79.2 fL — ABNORMAL LOW (ref 80.0–100.0)
MPV: 9.6 fL (ref 7.5–12.5)
Platelets: 365 10*3/uL (ref 140–400)
RBC: 4.33 MIL/uL (ref 3.80–5.10)
RDW: 14.5 % (ref 11.0–15.0)
WBC: 6.3 10*3/uL (ref 3.8–10.8)

## 2015-10-22 LAB — TSH: TSH: 0.72 m[IU]/L

## 2015-10-22 NOTE — Patient Instructions (Signed)
Contraception Choices Contraception (birth control) is the use of any methods or devices to prevent pregnancy. Below are some methods to help avoid pregnancy. HORMONAL METHODS   Contraceptive implant. This is a thin, plastic tube containing progesterone hormone. It does not contain estrogen hormone. Your health care provider inserts the tube in the inner part of the upper arm. The tube can remain in place for up to 3 years. After 3 years, the implant must be removed. The implant prevents the ovaries from releasing an egg (ovulation), thickens the cervical mucus to prevent sperm from entering the uterus, and thins the lining of the inside of the uterus.  Progesterone-only injections. These injections are given every 3 months by your health care provider to prevent pregnancy. This synthetic progesterone hormone stops the ovaries from releasing eggs. It also thickens cervical mucus and changes the uterine lining. This makes it harder for sperm to survive in the uterus.  Birth control pills. These pills contain estrogen and progesterone hormone. They work by preventing the ovaries from releasing eggs (ovulation). They also cause the cervical mucus to thicken, preventing the sperm from entering the uterus. Birth control pills are prescribed by a health care provider.Birth control pills can also be used to treat heavy periods.  Minipill. This type of birth control pill contains only the progesterone hormone. They are taken every day of each month and must be prescribed by your health care provider.  Birth control patch. The patch contains hormones similar to those in birth control pills. It must be changed once a week and is prescribed by a health care provider.  Vaginal ring. The ring contains hormones similar to those in birth control pills. It is left in the vagina for 3 weeks, removed for 1 week, and then a new one is put back in place. The patient must be comfortable inserting and removing the ring  from the vagina.A health care provider's prescription is necessary.  Emergency contraception. Emergency contraceptives prevent pregnancy after unprotected sexual intercourse. This pill can be taken right after sex or up to 5 days after unprotected sex. It is most effective the sooner you take the pills after having sexual intercourse. Most emergency contraceptive pills are available without a prescription. Check with your pharmacist. Do not use emergency contraception as your only form of birth control. BARRIER METHODS   Female condom. This is a thin sheath (latex or rubber) that is worn over the penis during sexual intercourse. It can be used with spermicide to increase effectiveness.  Female condom. This is a soft, loose-fitting sheath that is put into the vagina before sexual intercourse.  Diaphragm. This is a soft, latex, dome-shaped barrier that must be fitted by a health care provider. It is inserted into the vagina, along with a spermicidal jelly. It is inserted before intercourse. The diaphragm should be left in the vagina for 6 to 8 hours after intercourse.  Cervical cap. This is a round, soft, latex or plastic cup that fits over the cervix and must be fitted by a health care provider. The cap can be left in place for up to 48 hours after intercourse.  Sponge. This is a soft, circular piece of polyurethane foam. The sponge has spermicide in it. It is inserted into the vagina after wetting it and before sexual intercourse.  Spermicides. These are chemicals that kill or block sperm from entering the cervix and uterus. They come in the form of creams, jellies, suppositories, foam, or tablets. They do not require a   prescription. They are inserted into the vagina with an applicator before having sexual intercourse. The process must be repeated every time you have sexual intercourse. INTRAUTERINE CONTRACEPTION  Intrauterine device (IUD). This is a T-shaped device that is put in a woman's uterus  during a menstrual period to prevent pregnancy. There are 2 types:  Copper IUD. This type of IUD is wrapped in copper wire and is placed inside the uterus. Copper makes the uterus and fallopian tubes produce a fluid that kills sperm. It can stay in place for 10 years.  Hormone IUD. This type of IUD contains the hormone progestin (synthetic progesterone). The hormone thickens the cervical mucus and prevents sperm from entering the uterus, and it also thins the uterine lining to prevent implantation of a fertilized egg. The hormone can weaken or kill the sperm that get into the uterus. It can stay in place for 3-5 years, depending on which type of IUD is used. PERMANENT METHODS OF CONTRACEPTION  Female tubal ligation. This is when the woman's fallopian tubes are surgically sealed, tied, or blocked to prevent the egg from traveling to the uterus.  Hysteroscopic sterilization. This involves placing a small coil or insert into each fallopian tube. Your doctor uses a technique called hysteroscopy to do the procedure. The device causes scar tissue to form. This results in permanent blockage of the fallopian tubes, so the sperm cannot fertilize the egg. It takes about 3 months after the procedure for the tubes to become blocked. You must use another form of birth control for these 3 months.  Female sterilization. This is when the female has the tubes that carry sperm tied off (vasectomy).This blocks sperm from entering the vagina during sexual intercourse. After the procedure, the man can still ejaculate fluid (semen). NATURAL PLANNING METHODS  Natural family planning. This is not having sexual intercourse or using a barrier method (condom, diaphragm, cervical cap) on days the woman could become pregnant.  Calendar method. This is keeping track of the length of each menstrual cycle and identifying when you are fertile.  Ovulation method. This is avoiding sexual intercourse during ovulation.  Symptothermal  method. This is avoiding sexual intercourse during ovulation, using a thermometer and ovulation symptoms.  Post-ovulation method. This is timing sexual intercourse after you have ovulated. Regardless of which type or method of contraception you choose, it is important that you use condoms to protect against the transmission of sexually transmitted infections (STIs). Talk with your health care provider about which form of contraception is most appropriate for you.   This information is not intended to replace advice given to you by your health care provider. Make sure you discuss any questions you have with your health care provider.   Document Released: 01/19/2005 Document Revised: 01/24/2013 Document Reviewed: 07/14/2012 Elsevier Interactive Patient Education 2016 [employer] for Adults, Female A healthy lifestyle and preventive care can promote health and wellness. Preventive health guidelines for women include the following key practices.  A routine yearly physical is a good way to check with your health care provider about your health and preventive screening. It is a chance to share any concerns and updates on your health and to receive a thorough exam.  Visit your dentist for a routine exam and preventive care every 6 months. Brush your teeth twice a day and floss once a day. Good oral hygiene prevents tooth decay and gum disease.  The frequency of eye exams is based on your age, health, family medical history,  use of contact lenses, and other factors. Follow your health care provider's recommendations for frequency of eye exams.  Eat a healthy diet. Foods like vegetables, fruits, whole grains, low-fat dairy products, and lean protein foods contain the nutrients you need without too many calories. Decrease your intake of foods high in solid fats, added sugars, and salt. Eat the right amount of calories for you.Get information about a proper diet from your health care  provider, if necessary.  Regular physical exercise is one of the most important things you can do for your health. Most adults should get at least 150 minutes of moderate-intensity exercise (any activity that increases your heart rate and causes you to sweat) each week. In addition, most adults need muscle-strengthening exercises on 2 or more days a week.  Maintain a healthy weight. The body mass index (BMI) is a screening tool to identify possible weight problems. It provides an estimate of body fat based on height and weight. Your health care provider can find your BMI and can help you achieve or maintain a healthy weight.For adults 20 years and older:  A BMI below 18.5 is considered underweight.  A BMI of 18.5 to 24.9 is normal.  A BMI of 25 to 29.9 is considered overweight.  A BMI of 30 and above is considered obese.  Maintain normal blood lipids and cholesterol levels by exercising and minimizing your intake of saturated fat. Eat a balanced diet with plenty of fruit and vegetables. Blood tests for lipids and cholesterol should begin at age 22 and be repeated every 5 years. If your lipid or cholesterol levels are high, you are over 50, or you are at high risk for heart disease, you may need your cholesterol levels checked more frequently.Ongoing high lipid and cholesterol levels should be treated with medicines if diet and exercise are not working.  If you smoke, find out from your health care provider how to quit. If you do not use tobacco, do not start.  Lung cancer screening is recommended for adults aged 11-80 years who are at high risk for developing lung cancer because of a history of smoking. A yearly low-dose CT scan of the lungs is recommended for people who have at least a 30-pack-year history of smoking and are a current smoker or have quit within the past 15 years. A pack year of smoking is smoking an average of 1 pack of cigarettes a day for 1 year (for example: 1 pack a day for  30 years or 2 packs a day for 15 years). Yearly screening should continue until the smoker has stopped smoking for at least 15 years. Yearly screening should be stopped for people who develop a health problem that would prevent them from having lung cancer treatment.  If you are pregnant, do not drink alcohol. If you are breastfeeding, be very cautious about drinking alcohol. If you are not pregnant and choose to drink alcohol, do not have more than 1 drink per day. One drink is considered to be 12 ounces (355 mL) of beer, 5 ounces (148 mL) of wine, or 1.5 ounces (44 mL) of liquor.  Avoid use of street drugs. Do not share needles with anyone. Ask for help if you need support or instructions about stopping the use of drugs.  High blood pressure causes heart disease and increases the risk of stroke. Your blood pressure should be checked at least every 1 to 2 years. Ongoing high blood pressure should be treated with medicines if weight  loss and exercise do not work.  If you are 39-75 years old, ask your health care provider if you should take aspirin to prevent strokes.  Diabetes screening is done by taking a blood sample to check your blood glucose level after you have not eaten for a certain period of time (fasting). If you are not overweight and you do not have risk factors for diabetes, you should be screened once every 3 years starting at age 18. If you are overweight or obese and you are 31-32 years of age, you should be screened for diabetes every year as part of your cardiovascular risk assessment.  Breast cancer screening is essential preventive care for women. You should practice "breast self-awareness." This means understanding the normal appearance and feel of your breasts and may include breast self-examination. Any changes detected, no matter how small, should be reported to a health care provider. Women in their 78s and 30s should have a clinical breast exam (CBE) by a health care provider as  part of a regular health exam every 1 to 3 years. After age 34, women should have a CBE every year. Starting at age 12, women should consider having a mammogram (breast X-ray test) every year. Women who have a family history of breast cancer should talk to their health care provider about genetic screening. Women at a high risk of breast cancer should talk to their health care providers about having an MRI and a mammogram every year.  Breast cancer gene (BRCA)-related cancer risk assessment is recommended for women who have family members with BRCA-related cancers. BRCA-related cancers include breast, ovarian, tubal, and peritoneal cancers. Having family members with these cancers may be associated with an increased risk for harmful changes (mutations) in the breast cancer genes BRCA1 and BRCA2. Results of the assessment will determine the need for genetic counseling and BRCA1 and BRCA2 testing.  Your health care provider may recommend that you be screened regularly for cancer of the pelvic organs (ovaries, uterus, and vagina). This screening involves a pelvic examination, including checking for microscopic changes to the surface of your cervix (Pap test). You may be encouraged to have this screening done every 3 years, beginning at age 43.  For women ages 13-65, health care providers may recommend pelvic exams and Pap testing every 3 years, or they may recommend the Pap and pelvic exam, combined with testing for human papilloma virus (HPV), every 5 years. Some types of HPV increase your risk of cervical cancer. Testing for HPV may also be done on women of any age with unclear Pap test results.  Other health care providers may not recommend any screening for nonpregnant women who are considered low risk for pelvic cancer and who do not have symptoms. Ask your health care provider if a screening pelvic exam is right for you.  If you have had past treatment for cervical cancer or a condition that could lead  to cancer, you need Pap tests and screening for cancer for at least 20 years after your treatment. If Pap tests have been discontinued, your risk factors (such as having a new sexual partner) need to be reassessed to determine if screening should resume. Some women have medical problems that increase the chance of getting cervical cancer. In these cases, your health care provider may recommend more frequent screening and Pap tests.  Colorectal cancer can be detected and often prevented. Most routine colorectal cancer screening begins at the age of 39 years and continues through age 11  years. However, your health care provider may recommend screening at an earlier age if you have risk factors for colon cancer. On a yearly basis, your health care provider may provide home test kits to check for hidden blood in the stool. Use of a small camera at the end of a tube, to directly examine the colon (sigmoidoscopy or colonoscopy), can detect the earliest forms of colorectal cancer. Talk to your health care provider about this at age 85, when routine screening begins. Direct exam of the colon should be repeated every 5-10 years through age 69 years, unless early forms of precancerous polyps or small growths are found.  People who are at an increased risk for hepatitis B should be screened for this virus. You are considered at high risk for hepatitis B if:  You were born in a country where hepatitis B occurs often. Talk with your health care provider about which countries are considered high risk.  Your parents were born in a high-risk country and you have not received a shot to protect against hepatitis B (hepatitis B vaccine).  You have HIV or AIDS.  You use needles to inject street drugs.  You live with, or have sex with, someone who has hepatitis B.  You get hemodialysis treatment.  You take certain medicines for conditions like cancer, organ transplantation, and autoimmune conditions.  Hepatitis C  blood testing is recommended for all people born from [birth year] through 1964 and any individual with known risks for hepatitis C.  Practice safe sex. Use condoms and avoid high-risk sexual practices to reduce the spread of sexually transmitted infections (STIs). STIs include gonorrhea, chlamydia, syphilis, trichomonas, herpes, HPV, and human immunodeficiency virus (HIV). Herpes, HIV, and HPV are viral illnesses that have no cure. They can result in disability, cancer, and death.  You should be screened for sexually transmitted illnesses (STIs) including gonorrhea and chlamydia if:  You are sexually active and are younger than 24 years.  You are older than 24 years and your health care provider tells you that you are at risk for this type of infection.  Your sexual activity has changed since you were last screened and you are at an increased risk for chlamydia or gonorrhea. Ask your health care provider if you are at risk.  If you are at risk of being infected with HIV, it is recommended that you take a prescription medicine daily to prevent HIV infection. This is called preexposure prophylaxis (PrEP). You are considered at risk if:  You are sexually active and do not regularly use condoms or know the HIV status of your partner(s).  You take drugs by injection.  You are sexually active with a partner who has HIV.  Talk with your health care provider about whether you are at high risk of being infected with HIV. If you choose to begin PrEP, you should first be tested for HIV. You should then be tested every 3 months for as long as you are taking PrEP.  Osteoporosis is a disease in which the bones lose minerals and strength with aging. This can result in serious bone fractures or breaks. The risk of osteoporosis can be identified using a bone density scan. Women ages 38 years and over and women at risk for fractures or osteoporosis should discuss screening with their health care providers. Ask your  health care provider whether you should take a calcium supplement or vitamin D to reduce the rate of osteoporosis.  Menopause can be associated with physical symptoms  and risks. Hormone replacement therapy is available to decrease symptoms and risks. You should talk to your health care provider about whether hormone replacement therapy is right for you.  Use sunscreen. Apply sunscreen liberally and repeatedly throughout the day. You should seek shade when your shadow is shorter than you. Protect yourself by wearing long sleeves, pants, a wide-brimmed hat, and sunglasses year round, whenever you are outdoors.  Once a month, do a whole body skin exam, using a mirror to look at the skin on your back. Tell your health care provider of new moles, moles that have irregular borders, moles that are larger than a pencil eraser, or moles that have changed in shape or color.  Stay current with required vaccines (immunizations).  Influenza vaccine. All adults should be immunized every year.  Tetanus, diphtheria, and acellular pertussis (Td, Tdap) vaccine. Pregnant women should receive 1 dose of Tdap vaccine during each pregnancy. The dose should be obtained regardless of the length of time since the last dose. Immunization is preferred during the 27th-36th week of gestation. An adult who has not previously received Tdap or who does not know her vaccine status should receive 1 dose of Tdap. This initial dose should be followed by tetanus and diphtheria toxoids (Td) booster doses every 10 years. Adults with an unknown or incomplete history of completing a 3-dose immunization series with Td-containing vaccines should begin or complete a primary immunization series including a Tdap dose. Adults should receive a Td booster every 10 years.  Varicella vaccine. An adult without evidence of immunity to varicella should receive 2 doses or a second dose if she has previously received 1 dose. Pregnant females who do not have  evidence of immunity should receive the first dose after pregnancy. This first dose should be obtained before leaving the health care facility. The second dose should be obtained 4-8 weeks after the first dose.  Human papillomavirus (HPV) vaccine. Females aged 13-26 years who have not received the vaccine previously should obtain the 3-dose series. The vaccine is not recommended for use in pregnant females. However, pregnancy testing is not needed before receiving a dose. If a female is found to be pregnant after receiving a dose, no treatment is needed. In that case, the remaining doses should be delayed until after the pregnancy. Immunization is recommended for any person with an immunocompromised condition through the age of 49 years if she did not get any or all doses earlier. During the 3-dose series, the second dose should be obtained 4-8 weeks after the first dose. The third dose should be obtained 24 weeks after the first dose and 16 weeks after the second dose.  Zoster vaccine. One dose is recommended for adults aged 67 years or older unless certain conditions are present.  Measles, mumps, and rubella (MMR) vaccine. Adults born before [birth year] generally are considered immune to measles and mumps. Adults born in [birth year] or later should have 1 or more doses of MMR vaccine unless there is a contraindication to the vaccine or there is laboratory evidence of immunity to each of the three diseases. A routine second dose of MMR vaccine should be obtained at least 28 days after the first dose for students attending postsecondary schools, health care workers, or international travelers. People who received inactivated measles vaccine or an unknown type of measles vaccine during 1963-1967 should receive 2 doses of MMR vaccine. People who received inactivated mumps vaccine or an unknown type of mumps vaccine before 1979 and are at high  risk for mumps infection should consider immunization with 2 doses of MMR vaccine.  For females of childbearing age, rubella immunity should be determined. If there is no evidence of immunity, females who are not pregnant should be vaccinated. If there is no evidence of immunity, females who are pregnant should delay immunization until after pregnancy. Unvaccinated health care workers born before [birth year] who lack laboratory evidence of measles, mumps, or rubella immunity or laboratory confirmation of disease should consider measles and mumps immunization with 2 doses of MMR vaccine or rubella immunization with 1 dose of MMR vaccine.  Pneumococcal 13-valent conjugate (PCV13) vaccine. When indicated, a person who is uncertain of his immunization history and has no record of immunization should receive the PCV13 vaccine. All adults 55 years of age and older should receive this vaccine. An adult aged 57 years or older who has certain medical conditions and has not been previously immunized should receive 1 dose of PCV13 vaccine. This PCV13 should be followed with a dose of pneumococcal polysaccharide (PPSV23) vaccine. Adults who are at high risk for pneumococcal disease should obtain the PPSV23 vaccine at least 8 weeks after the dose of PCV13 vaccine. Adults older than 39 years of age who have normal immune system function should obtain the PPSV23 vaccine dose at least 1 year after the dose of PCV13 vaccine.  Pneumococcal polysaccharide (PPSV23) vaccine. When PCV13 is also indicated, PCV13 should be obtained first. All adults aged 71 years and older should be immunized. An adult younger than age 78 years who has certain medical conditions should be immunized. Any person who resides in a nursing home or long-term care facility should be immunized. An adult smoker should be immunized. People with an immunocompromised condition and certain other conditions should receive both PCV13 and PPSV23 vaccines. People with human immunodeficiency virus (HIV) infection should be immunized as soon as possible after  diagnosis. Immunization during chemotherapy or radiation therapy should be avoided. Routine use of PPSV23 vaccine is not recommended for American Indians, [place] Natives, or people younger than 65 years unless there are medical conditions that require PPSV23 vaccine. When indicated, people who have unknown immunization and have no record of immunization should receive PPSV23 vaccine. One-time revaccination 5 years after the first dose of PPSV23 is recommended for people aged 19-64 years who have chronic kidney failure, nephrotic syndrome, asplenia, or immunocompromised conditions. People who received 1-2 doses of PPSV23 before age 34 years should receive another dose of PPSV23 vaccine at age 82 years or later if at least 5 years have passed since the previous dose. Doses of PPSV23 are not needed for people immunized with PPSV23 at or after age 15 years.  Meningococcal vaccine. Adults with asplenia or persistent complement component deficiencies should receive 2 doses of quadrivalent meningococcal conjugate (MenACWY-D) vaccine. The doses should be obtained at least 2 months apart. Microbiologists working with certain meningococcal bacteria, [place] recruits, people at risk during an outbreak, and people who travel to or live in countries with a high rate of meningitis should be immunized. A first-year college student up through age 42 years who is living in a residence hall should receive a dose if she did not receive a dose on or after her 16th birthday. Adults who have certain high-risk conditions should receive one or more doses of vaccine.  Hepatitis A vaccine. Adults who wish to be protected from this disease, have certain high-risk conditions, work with hepatitis A-infected animals, work in hepatitis A research labs, or travel to or  work in countries with a high rate of hepatitis A should be immunized. Adults who were previously unvaccinated and who anticipate close contact with an international adoptee  during the first 60 days after arrival in the [place] from a country with a high rate of hepatitis A should be immunized.  Hepatitis B vaccine. Adults who wish to be protected from this disease, have certain high-risk conditions, may be exposed to blood or other infectious body fluids, are household contacts or sex partners of hepatitis B positive people, are clients or workers in certain care facilities, or travel to or work in countries with a high rate of hepatitis B should be immunized.  Haemophilus influenzae type b (Hib) vaccine. A previously unvaccinated person with asplenia or sickle cell disease or having a scheduled splenectomy should receive 1 dose of Hib vaccine. Regardless of previous immunization, a recipient of a hematopoietic stem cell transplant should receive a 3-dose series 6-12 months after her successful transplant. Hib vaccine is not recommended for adults with HIV infection. Preventive Services / Frequency Ages 76 to 23 years  Blood pressure check.** / Every 3-5 years.  Lipid and cholesterol check.** / Every 5 years beginning at age 57.  Clinical breast exam.** / Every 3 years for women in their 73s and 38s.  BRCA-related cancer risk assessment.** / For women who have family members with a BRCA-related cancer (breast, ovarian, tubal, or peritoneal cancers).  Pap test.** / Every 2 years from ages 28 through 43. Every 3 years starting at age 27 through age 20 or 20 with a history of 3 consecutive normal Pap tests.  HPV screening.** / Every 3 years from ages 89 through ages 7 to 79 with a history of 3 consecutive normal Pap tests.  Hepatitis C blood test.** / For any individual with known risks for hepatitis C.  Skin self-exam. / Monthly.  Influenza vaccine. / Every year.  Tetanus, diphtheria, and acellular pertussis (Tdap, Td) vaccine.** / Consult your health care provider. Pregnant women should receive 1 dose of Tdap vaccine during each pregnancy. 1 dose of Td  every 10 years.  Varicella vaccine.** / Consult your health care provider. Pregnant females who do not have evidence of immunity should receive the first dose after pregnancy.  HPV vaccine. / 3 doses over 6 months, if 13 and younger. The vaccine is not recommended for use in pregnant females. However, pregnancy testing is not needed before receiving a dose.  Measles, mumps, rubella (MMR) vaccine.** / You need at least 1 dose of MMR if you were born in 1957 or later. You may also need a 2nd dose. For females of childbearing age, rubella immunity should be determined. If there is no evidence of immunity, females who are not pregnant should be vaccinated. If there is no evidence of immunity, females who are pregnant should delay immunization until after pregnancy.  Pneumococcal 13-valent conjugate (PCV13) vaccine.** / Consult your health care provider.  Pneumococcal polysaccharide (PPSV23) vaccine.** / 1 to 2 doses if you smoke cigarettes or if you have certain conditions.  Meningococcal vaccine.** / 1 dose if you are age 72 to 71 years and a Market researcher living in a residence hall, or have one of several medical conditions, you need to get vaccinated against meningococcal disease. You may also need additional booster doses.  Hepatitis A vaccine.** / Consult your health care provider.  Hepatitis B vaccine.** / Consult your health care provider.  Haemophilus influenzae type b (Hib) vaccine.** / Consult your  health care provider. Ages 35 to 72 years  Blood pressure check.** / Every year.  Lipid and cholesterol check.** / Every 5 years beginning at age 42 years.  Lung cancer screening. / Every year if you are aged 51-80 years and have a 30-pack-year history of smoking and currently smoke or have quit within the past 15 years. Yearly screening is stopped once you have quit smoking for at least 15 years or develop a health problem that would prevent you from having lung cancer  treatment.  Clinical breast exam.** / Every year after age 27 years.  BRCA-related cancer risk assessment.** / For women who have family members with a BRCA-related cancer (breast, ovarian, tubal, or peritoneal cancers).  Mammogram.** / Every year beginning at age 79 years and continuing for as long as you are in good health. Consult with your health care provider.  Pap test.** / Every 3 years starting at age 30 years through age 59 or 36 years with a history of 3 consecutive normal Pap tests.  HPV screening.** / Every 3 years from ages 85 years through ages 69 to 20 years with a history of 3 consecutive normal Pap tests.  Fecal occult blood test (FOBT) of stool. / Every year beginning at age 23 years and continuing until age 47 years. You may not need to do this test if you get a colonoscopy every 10 years.  Flexible sigmoidoscopy or colonoscopy.** / Every 5 years for a flexible sigmoidoscopy or every 10 years for a colonoscopy beginning at age 76 years and continuing until age 4 years.  Hepatitis C blood test.** / For all people born from [birth year] through 1964 and any individual with known risks for hepatitis C.  Skin self-exam. / Monthly.  Influenza vaccine. / Every year.  Tetanus, diphtheria, and acellular pertussis (Tdap/Td) vaccine.** / Consult your health care provider. Pregnant women should receive 1 dose of Tdap vaccine during each pregnancy. 1 dose of Td every 10 years.  Varicella vaccine.** / Consult your health care provider. Pregnant females who do not have evidence of immunity should receive the first dose after pregnancy.  Zoster vaccine.** / 1 dose for adults aged 72 years or older.  Measles, mumps, rubella (MMR) vaccine.** / You need at least 1 dose of MMR if you were born in 1957 or later. You may also need a second dose. For females of childbearing age, rubella immunity should be determined. If there is no evidence of immunity, females who are not pregnant should be  vaccinated. If there is no evidence of immunity, females who are pregnant should delay immunization until after pregnancy.  Pneumococcal 13-valent conjugate (PCV13) vaccine.** / Consult your health care provider.  Pneumococcal polysaccharide (PPSV23) vaccine.** / 1 to 2 doses if you smoke cigarettes or if you have certain conditions.  Meningococcal vaccine.** / Consult your health care provider.  Hepatitis A vaccine.** / Consult your health care provider.  Hepatitis B vaccine.** / Consult your health care provider.  Haemophilus influenzae type b (Hib) vaccine.** / Consult your health care provider. Ages 55 years and over  Blood pressure check.** / Every year.  Lipid and cholesterol check.** / Every 5 years beginning at age 58 years.  Lung cancer screening. / Every year if you are aged 61-80 years and have a 30-pack-year history of smoking and currently smoke or have quit within the past 15 years. Yearly screening is stopped once you have quit smoking for at least 15 years or develop a health problem that  would prevent you from having lung cancer treatment.  Clinical breast exam.** / Every year after age 51 years.  BRCA-related cancer risk assessment.** / For women who have family members with a BRCA-related cancer (breast, ovarian, tubal, or peritoneal cancers).  Mammogram.** / Every year beginning at age 24 years and continuing for as long as you are in good health. Consult with your health care provider.  Pap test.** / Every 3 years starting at age 75 years through age 73 or 79 years with 3 consecutive normal Pap tests. Testing can be stopped between 65 and 70 years with 3 consecutive normal Pap tests and no abnormal Pap or HPV tests in the past 10 years.  HPV screening.** / Every 3 years from ages 27 years through ages 36 or 87 years with a history of 3 consecutive normal Pap tests. Testing can be stopped between 65 and 70 years with 3 consecutive normal Pap tests and no abnormal Pap  or HPV tests in the past 10 years.  Fecal occult blood test (FOBT) of stool. / Every year beginning at age 75 years and continuing until age 65 years. You may not need to do this test if you get a colonoscopy every 10 years.  Flexible sigmoidoscopy or colonoscopy.** / Every 5 years for a flexible sigmoidoscopy or every 10 years for a colonoscopy beginning at age 63 years and continuing until age 35 years.  Hepatitis C blood test.** / For all people born from [birth year] through 1964 and any individual with known risks for hepatitis C.  Osteoporosis screening.** / A one-time screening for women ages 15 years and over and women at risk for fractures or osteoporosis.  Skin self-exam. / Monthly.  Influenza vaccine. / Every year.  Tetanus, diphtheria, and acellular pertussis (Tdap/Td) vaccine.** / 1 dose of Td every 10 years.  Varicella vaccine.** / Consult your health care provider.  Zoster vaccine.** / 1 dose for adults aged 35 years or older.  Pneumococcal 13-valent conjugate (PCV13) vaccine.** / Consult your health care provider.  Pneumococcal polysaccharide (PPSV23) vaccine.** / 1 dose for all adults aged 59 years and older.  Meningococcal vaccine.** / Consult your health care provider.  Hepatitis A vaccine.** / Consult your health care provider.  Hepatitis B vaccine.** / Consult your health care provider.  Haemophilus influenzae type b (Hib) vaccine.** / Consult your health care provider. ** Family history and personal history of risk and conditions may change your health care provider's recommendations.   This information is not intended to replace advice given to you by your health care provider. Make sure you discuss any questions you have with your health care provider.   Document Released: 03/17/2001 Document Revised: 02/09/2014 Document Reviewed: 06/16/2010 Elsevier Interactive Patient Education [employer].

## 2015-10-22 NOTE — Progress Notes (Signed)
   Subjective:     April Daniels is a 40 y.o. female and is here for a comprehensive physical exam. The patient reports problems - fatigue and is anemic when trying to give blood.  Social History   Social History  . Marital status: Married    Spouse name: N/A  . Number of children: N/A  . Years of education: N/A   Occupational History  . Not on file.   Social History Main Topics  . Smoking status: Former Smoker    Types: Cigarettes    Quit date: 08/14/2006  . Smokeless tobacco: Never Used  . Alcohol use No  . Drug use: No  . Sexual activity: Yes    Partners: Male    Birth control/ protection: Coitus interruptus   Other Topics Concern  . Not on file   Social History Narrative  . No narrative on file   Health Maintenance  Topic Date Due  . PAP SMEAR  06/13/2016  . TETANUS/TDAP  11/06/2020  . HIV Screening  Completed    The following portions of the patient's history were reviewed and updated as appropriate: allergies, current medications, past family history, past medical history, past social history, past surgical history and problem list.  Review of Systems Pertinent items noted in HPI and remainder of comprehensive ROS otherwise negative.   Objective:    BP 118/70   Pulse 99   Ht 5\' 3"  (1.6 m)   Wt 192 lb (87.1 kg)   LMP 10/15/2015   BMI 34.01 kg/m  General appearance: alert, cooperative and appears stated age Head: Normocephalic, without obvious abnormality, atraumatic Neck: no adenopathy, supple, symmetrical, trachea midline and thyroid not enlarged, symmetric, no tenderness/mass/nodules Lungs: clear to auscultation bilaterally Breasts: normal appearance, no masses or tenderness Heart: regular rate and rhythm, S1, S2 normal, no murmur, click, rub or gallop Abdomen: soft, non-tender; bowel sounds normal; no masses,  no organomegaly Pelvic: cervix normal in appearance, external genitalia normal, no adnexal masses or tenderness, no cervical motion  tenderness, uterus normal size, shape, and consistency and vagina normal without discharge Extremities: extremities normal, atraumatic, no cyanosis or edema Pulses: 2+ and symmetric Skin: Skin color, texture, turgor normal. No rashes or lesions Lymph nodes: Cervical, supraclavicular, and axillary nodes normal. Neurologic: Grossly normal    Assessment:    Healthy female exam.      Plan:      Problem List Items Addressed This Visit    None    Visit Diagnoses    Encounter for routine gynecological examination    -  Primary   Relevant Orders   Comprehensive metabolic panel   CBC   Lipid panel   TSH   Screening for malignant neoplasm of cervix       Relevant Orders   Cytology - PAP      See After Visit Summary for Counseling Recommendations

## 2015-10-23 LAB — CYTOLOGY - PAP

## 2016-01-21 DIAGNOSIS — J4530 Mild persistent asthma, uncomplicated: Secondary | ICD-10-CM | POA: Diagnosis not present

## 2016-01-21 DIAGNOSIS — J301 Allergic rhinitis due to pollen: Secondary | ICD-10-CM | POA: Diagnosis not present

## 2016-01-21 DIAGNOSIS — J3089 Other allergic rhinitis: Secondary | ICD-10-CM | POA: Diagnosis not present

## 2016-01-21 DIAGNOSIS — H1045 Other chronic allergic conjunctivitis: Secondary | ICD-10-CM | POA: Diagnosis not present

## 2016-11-27 ENCOUNTER — Encounter: Payer: Self-pay | Admitting: *Deleted

## 2016-12-29 ENCOUNTER — Ambulatory Visit: Payer: BLUE CROSS/BLUE SHIELD | Admitting: Obstetrics & Gynecology

## 2016-12-29 ENCOUNTER — Ambulatory Visit: Payer: BLUE CROSS/BLUE SHIELD | Admitting: Obstetrics and Gynecology

## 2016-12-29 ENCOUNTER — Encounter: Payer: Self-pay | Admitting: Obstetrics and Gynecology

## 2016-12-29 ENCOUNTER — Ambulatory Visit (INDEPENDENT_AMBULATORY_CARE_PROVIDER_SITE_OTHER): Payer: BLUE CROSS/BLUE SHIELD | Admitting: Obstetrics and Gynecology

## 2016-12-29 VITALS — BP 129/84 | HR 78

## 2016-12-29 DIAGNOSIS — Z1151 Encounter for screening for human papillomavirus (HPV): Secondary | ICD-10-CM

## 2016-12-29 DIAGNOSIS — Z01419 Encounter for gynecological examination (general) (routine) without abnormal findings: Secondary | ICD-10-CM

## 2016-12-29 DIAGNOSIS — Z124 Encounter for screening for malignant neoplasm of cervix: Secondary | ICD-10-CM

## 2016-12-29 NOTE — Progress Notes (Signed)
Desires fasting labs and pap smear today.

## 2016-12-29 NOTE — Progress Notes (Signed)
Subjective:     April Daniels is a 41 y.o. female G2P2 here for a comprehensive physical exam. The patient reports no problems. She is sexually active without contraception and would welcome a pregnancy if it were to occur. Patient reports a monthly period lasting 5 days. Patient denies any pelvic pain or abnormal discharge. She denies any urinary incontinence. Patient is fasting currently and desires health maintenance labs  Past Medical History:  Diagnosis Date  . Allergy-induced asthma   . Asthma    allergy induced  . GERD (gastroesophageal reflux disease)    with pregnancy  . Mitral valve prolapse 1997   Past Surgical History:  Procedure Laterality Date  . APPENDECTOMY    . CESAREAN SECTION    . CESAREAN SECTION  11/06/2010   Procedure: CESAREAN SECTION;  Surgeon: Hollie SalkMyra C. Marice Potterove, MD;  Location: [facility];  Service: Gynecology;  Laterality: N/A;   Family History  Problem Relation Age of Onset  . Diabetes Father   . Hypertension Mother   . Graves' disease Mother   . Hypertension Brother     Social History   Socioeconomic History  . Marital status: Married    Spouse name: Not on file  . Number of children: Not on file  . Years of education: Not on file  . Highest education level: Not on file  Social Needs  . Financial resource strain: Not on file  . Food insecurity - worry: Not on file  . Food insecurity - inability: Not on file  . Transportation needs - medical: Not on file  . Transportation needs - non-medical: Not on file  Occupational History  . Not on file  Tobacco Use  . Smoking status: Former Smoker    Types: Cigarettes    Last attempt to quit: 08/14/2006    Years since quitting: 10.3  . Smokeless tobacco: Never Used  Substance and Sexual Activity  . Alcohol use: No  . Drug use: No  . Sexual activity: Yes    Partners: Male    Birth control/protection: Coitus interruptus  Other Topics Concern  . Not on file  Social History Narrative  . Not on file    Health Maintenance  Topic Date Due  . PAP SMEAR  10/22/2018  . TETANUS/TDAP  11/06/2020  . HIV Screening  Completed    Review of Systems Pertinent items are noted in HPI.   Objective:  Blood pressure 129/84, pulse 78, last menstrual period 12/20/2016.     GENERAL: Well-developed, well-nourished female in no acute distress.  HEENT: Normocephalic, atraumatic. Sclerae anicteric.  NECK: Supple. Normal thyroid.  LUNGS: Clear to auscultation bilaterally.  HEART: Regular rate and rhythm. BREASTS: Symmetric in size. No palpable masses or lymphadenopathy, skin changes, or nipple drainage. ABDOMEN: Soft, nontender, nondistended. No organomegaly. PELVIC: Normal external female genitalia. Vagina is pink and rugated.  Normal discharge. Normal appearing cervix. Uterus is normal in size. No adnexal mass or tenderness. EXTREMITIES: No cyanosis, clubbing, or edema, 2+ distal pulses.    Assessment:    Healthy female exam.      Plan:    Pap smear collected Labs collected Screening mammogram ordered Patient will be contacted with any abnormal results RTC in 1 year or prn See After Visit Summary for Counseling Recommendations

## 2016-12-29 NOTE — Progress Notes (Signed)
LAST PAP 10/2015

## 2016-12-29 NOTE — Addendum Note (Signed)
Addended by: Arne ClevelandHUTCHINSON, MANDY J on: 12/29/2016 02:28 PM   Modules accepted: Orders

## 2016-12-30 LAB — LIPID PANEL
Chol/HDL Ratio: 2.7 ratio (ref 0.0–4.4)
Cholesterol, Total: 173 mg/dL (ref 100–199)
HDL: 65 mg/dL (ref >39)
LDL Calculated: 95 mg/dL (ref 0–99)
Triglycerides: 66 mg/dL (ref 0–149)
VLDL Cholesterol Cal: 13 mg/dL (ref 5–40)

## 2016-12-30 LAB — COMPREHENSIVE METABOLIC PANEL
ALT: 14 IU/L (ref 0–32)
AST: 18 IU/L (ref 0–40)
Albumin/Globulin Ratio: 1.7 (ref 1.2–2.2)
Albumin: 4.5 g/dL (ref 3.5–5.5)
Alkaline Phosphatase: 74 IU/L (ref 39–117)
BUN/Creatinine Ratio: 10 (ref 9–23)
BUN: 9 mg/dL (ref 6–24)
Bilirubin Total: 0.3 mg/dL (ref 0.0–1.2)
CO2: 23 mmol/L (ref 20–29)
Calcium: 9.3 mg/dL (ref 8.7–10.2)
Chloride: 100 mmol/L (ref 96–106)
Creatinine, Ser: 0.91 mg/dL (ref 0.57–1.00)
GFR calc Af Amer: 91 mL/min/{1.73_m2} (ref >59)
GFR calc non Af Amer: 79 mL/min/{1.73_m2} (ref >59)
Globulin, Total: 2.6 g/dL (ref 1.5–4.5)
Glucose: 84 mg/dL (ref 65–99)
Potassium: 4.1 mmol/L (ref 3.5–5.2)
Sodium: 136 mmol/L (ref 134–144)
Total Protein: 7.1 g/dL (ref 6.0–8.5)

## 2016-12-30 LAB — TSH: TSH: 0.91 u[IU]/mL (ref 0.450–4.500)

## 2016-12-30 LAB — CBC
Hematocrit: 35.3 % (ref 34.0–46.6)
Hemoglobin: 11.7 g/dL (ref 11.1–15.9)
MCH: 25.4 pg — ABNORMAL LOW (ref 26.6–33.0)
MCHC: 33.1 g/dL (ref 31.5–35.7)
MCV: 77 fL — ABNORMAL LOW (ref 79–97)
Platelets: 336 10*3/uL (ref 150–379)
RBC: 4.61 x10E6/uL (ref 3.77–5.28)
RDW: 15.9 % — ABNORMAL HIGH (ref 12.3–15.4)
WBC: 6.6 10*3/uL (ref 3.4–10.8)

## 2016-12-30 LAB — VITAMIN D 25 HYDROXY (VIT D DEFICIENCY, FRACTURES): Vit D, 25-Hydroxy: 29.6 ng/mL — ABNORMAL LOW (ref 30.0–100.0)

## 2016-12-30 LAB — HEMOGLOBIN A1C
Est. average glucose Bld gHb Est-mCnc: 111 mg/dL
Hgb A1c MFr Bld: 5.5 % (ref 4.8–5.6)

## 2017-01-01 LAB — CYTOLOGY - PAP
Diagnosis: NEGATIVE
HPV: NOT DETECTED

## 2017-01-06 ENCOUNTER — Encounter: Payer: Self-pay | Admitting: Obstetrics and Gynecology

## 2017-01-19 DIAGNOSIS — J4530 Mild persistent asthma, uncomplicated: Secondary | ICD-10-CM | POA: Diagnosis not present

## 2017-01-19 DIAGNOSIS — J301 Allergic rhinitis due to pollen: Secondary | ICD-10-CM | POA: Diagnosis not present

## 2017-01-19 DIAGNOSIS — H1045 Other chronic allergic conjunctivitis: Secondary | ICD-10-CM | POA: Diagnosis not present

## 2017-01-19 DIAGNOSIS — J3089 Other allergic rhinitis: Secondary | ICD-10-CM | POA: Diagnosis not present

## 2017-01-22 ENCOUNTER — Other Ambulatory Visit: Payer: Self-pay | Admitting: Obstetrics and Gynecology

## 2017-01-22 ENCOUNTER — Ambulatory Visit
Admission: RE | Admit: 2017-01-22 | Discharge: 2017-01-22 | Disposition: A | Discharge status: Home / Self Care | Payer: BLUE CROSS/BLUE SHIELD | Source: Ambulatory Visit | Attending: Obstetrics and Gynecology | Admitting: Obstetrics and Gynecology

## 2017-01-22 DIAGNOSIS — Z01419 Encounter for gynecological examination (general) (routine) without abnormal findings: Secondary | ICD-10-CM

## 2017-01-22 DIAGNOSIS — Z1231 Encounter for screening mammogram for malignant neoplasm of breast: Secondary | ICD-10-CM

## 2017-02-17 ENCOUNTER — Encounter: Payer: Self-pay | Admitting: Family Medicine

## 2017-02-17 ENCOUNTER — Ambulatory Visit: Payer: BLUE CROSS/BLUE SHIELD | Admitting: Family Medicine

## 2017-02-17 ENCOUNTER — Other Ambulatory Visit: Payer: Self-pay

## 2017-02-17 VITALS — BP 118/88 | HR 74 | Temp 98.4°F | Ht 62.75 in | Wt 186.8 lb

## 2017-02-17 DIAGNOSIS — J208 Acute bronchitis due to other specified organisms: Secondary | ICD-10-CM | POA: Diagnosis not present

## 2017-02-17 DIAGNOSIS — J4520 Mild intermittent asthma, uncomplicated: Secondary | ICD-10-CM | POA: Diagnosis not present

## 2017-02-17 MED ORDER — AZITHROMYCIN 250 MG PO TABS: ORAL_TABLET | ORAL | 0 refills | Status: DC

## 2017-02-17 MED ORDER — HYDROCODONE-HOMATROPINE 5-1.5 MG/5ML PO SYRP: ORAL_SOLUTION | ORAL | 0 refills | Status: DC

## 2017-02-17 NOTE — Progress Notes (Signed)
Dr. Karleen Hampshire T. Mylinh Cragg, MD, CAQ Sports [facility] [address] [place], [address] Phone: [phone] Fax: [phone]  02/17/2017  Patient: April Daniels, MRN: [id number], DOB: October 09, 1975, 42 y.o.  Primary Physician:  Tower, Audrie Gallus, MD   Chief Complaint  Patient presents with  . Nasal Congestion  . Cough    worse at night/can't sleep  . Fever    low grade   Subjective:   April Daniels is a 42 y.o. very pleasant female patient who presents with the following:  A couple of weeks ago, had a little bit of a sore throat. Would wake up and cough up a little bit. Ran a fever for a day, took nyquil. Felt better for a couple days, then bad agin. Nowfeeling more or less ok and having a really bad cough at night.   Produces some cough and had a low grade this morning.   Also has some asthma. Former smoker.   Past Medical History, Surgical History, Social History, Family History, Problem List, Medications, and Allergies have been reviewed and updated if relevant.  Patient Active Problem List   Diagnosis Date Noted  . Elevated BP 03/14/2013  . DERMATOPHYTOSIS OF NAIL 11/30/2007  . ALLERGIC RHINITIS 01/25/2007  . ASTHMA 01/25/2007    Past Medical History:  Diagnosis Date  . Allergy-induced asthma   . Asthma    allergy induced  . GERD (gastroesophageal reflux disease)    with pregnancy  . Mitral valve prolapse 1997    Past Surgical History:  Procedure Laterality Date  . APPENDECTOMY    . CESAREAN SECTION    . CESAREAN SECTION  11/06/2010   Procedure: CESAREAN SECTION;  Surgeon: Hollie Salk C. [name], MD;  Location: [facility];  Service: Gynecology;  Laterality: N/A;    Social History   Socioeconomic History  . Marital status: Married    Spouse name: Not on file  . Number of children: Not on file  . Years of education: Not on file  . Highest education level: Not on file  Social Needs  . Financial resource strain: Not on file    . Food insecurity - worry: Not on file  . Food insecurity - inability: Not on file  . Transportation needs - medical: Not on file  . Transportation needs - non-medical: Not on file  Occupational History  . Not on file  Tobacco Use  . Smoking status: Former Smoker    Types: Cigarettes    Last attempt to quit: 08/14/2006    Years since quitting: 10.5  . Smokeless tobacco: Never Used  Substance and Sexual Activity  . Alcohol use: No  . Drug use: No  . Sexual activity: Yes    Partners: Male    Birth control/protection: Coitus interruptus  Other Topics Concern  . Not on file  Social History Narrative  . Not on file    Family History  Problem Relation Age of Onset  . Diabetes Father   . Hypertension Mother   . Graves' disease Mother   . Hypertension Brother   . Breast cancer Neg Hx     Allergies  Allergen Reactions  . Ortho Tri-Cyclen [Norgestimate-Eth Estradiol]     Raised blood pressure  . Sulfamethoxazole-Trimethoprim     REACTION: GI upset    Medication list reviewed and updated in full in [facility].  ROS: GEN: Acute illness details above GI: Tolerating PO intake GU: maintaining adequate hydration and urination Pulm:  No SOB Interactive and getting along well at home.  Otherwise, ROS is as per the HPI.  Objective:   BP 118/88   Pulse 74   Temp 98.4 F (36.9 C) (Oral)   Ht 5' 2.75" (1.594 m)   Wt 186 lb 12 oz (84.7 kg)   LMP 02/13/2017   SpO2 97%   BMI 33.35 kg/m    GEN: A and O x 3. WDWN. NAD.    ENT: Nose clear, ext NML.  No LAD.  No JVD.  TM's clear. Oropharynx clear.  PULM: Normal WOB, no distress. No crackles, wheezes, rhonchi. CV: RRR, no M/G/R, No rubs, No JVD.   EXT: warm and well-perfused, No c/c/e. PSYCH: Pleasant and conversant.    Laboratory and Imaging Data:  Assessment and Plan:   Acute bronchitis due to other specified organisms  Mild intermittent asthma without complication  She appears stable with > 2 weeks of sx,  asthma, reasonable to cover with abx. Cough meds for supportive care.   Follow-up: No Follow-up on file.  Meds ordered this encounter  Medications  . azithromycin (ZITHROMAX) 250 MG tablet    Sig: 2 tabs po on day 1, then 1 tab po for 4 days    Dispense:  6 tablet    Refill:  0  . HYDROcodone-homatropine (HYCODAN) 5-1.5 MG/5ML syrup    Sig: 1 tsp po at night before bed prn cough    Dispense:  120 mL    Refill:  0   Signed,  Lezlie Ritchey T. Makinna Andy, MD   Allergies as of 02/17/2017      Reactions   Ortho Tri-cyclen [norgestimate-eth Estradiol]    Raised blood pressure   Sulfamethoxazole-trimethoprim    REACTION: GI upset      Medication List        Accurate as of 02/17/17 12:44 PM. Always use your most recent med list.          azithromycin 250 MG tablet Commonly known as:  ZITHROMAX 2 tabs po on day 1, then 1 tab po for 4 days   cetirizine 10 MG tablet Commonly known as:  ZYRTEC Take 10 mg by mouth daily.   fluticasone 50 MCG/ACT nasal spray Commonly known as:  FLONASE Place 2 sprays into the nose daily.   HYDROcodone-homatropine 5-1.5 MG/5ML syrup Commonly known as:  HYCODAN 1 tsp po at night before bed prn cough   levocetirizine 5 MG tablet Commonly known as:  XYZAL Take 5 mg by mouth every evening.   montelukast 10 MG tablet Commonly known as:  SINGULAIR Take 10 mg by mouth every evening.   PROAIR HFA 108 (90 Base) MCG/ACT inhaler Generic drug:  albuterol Inhale into the lungs every 6 (six) hours as needed for wheezing or shortness of breath.

## 2017-07-12 DIAGNOSIS — R41840 Attention and concentration deficit: Secondary | ICD-10-CM | POA: Diagnosis not present

## 2017-07-12 DIAGNOSIS — F419 Anxiety disorder, unspecified: Secondary | ICD-10-CM | POA: Diagnosis not present

## 2017-07-12 DIAGNOSIS — F902 Attention-deficit hyperactivity disorder, combined type: Secondary | ICD-10-CM | POA: Diagnosis not present

## 2017-07-12 DIAGNOSIS — Z79899 Other long term (current) drug therapy: Secondary | ICD-10-CM | POA: Diagnosis not present

## 2017-08-24 DIAGNOSIS — F902 Attention-deficit hyperactivity disorder, combined type: Secondary | ICD-10-CM | POA: Diagnosis not present

## 2017-08-24 DIAGNOSIS — F419 Anxiety disorder, unspecified: Secondary | ICD-10-CM | POA: Diagnosis not present

## 2017-08-24 DIAGNOSIS — Z79899 Other long term (current) drug therapy: Secondary | ICD-10-CM | POA: Diagnosis not present

## 2017-11-01 ENCOUNTER — Encounter: Payer: Self-pay | Admitting: Radiology

## 2017-12-01 DIAGNOSIS — Z79899 Other long term (current) drug therapy: Secondary | ICD-10-CM | POA: Diagnosis not present

## 2017-12-01 DIAGNOSIS — F902 Attention-deficit hyperactivity disorder, combined type: Secondary | ICD-10-CM | POA: Diagnosis not present

## 2017-12-01 DIAGNOSIS — F419 Anxiety disorder, unspecified: Secondary | ICD-10-CM | POA: Diagnosis not present

## 2018-01-11 DIAGNOSIS — F902 Attention-deficit hyperactivity disorder, combined type: Secondary | ICD-10-CM | POA: Diagnosis not present

## 2018-01-11 DIAGNOSIS — Z79899 Other long term (current) drug therapy: Secondary | ICD-10-CM | POA: Diagnosis not present

## 2018-01-11 DIAGNOSIS — F419 Anxiety disorder, unspecified: Secondary | ICD-10-CM | POA: Diagnosis not present

## 2018-01-20 DIAGNOSIS — H1045 Other chronic allergic conjunctivitis: Secondary | ICD-10-CM | POA: Diagnosis not present

## 2018-01-20 DIAGNOSIS — J301 Allergic rhinitis due to pollen: Secondary | ICD-10-CM | POA: Diagnosis not present

## 2018-01-20 DIAGNOSIS — J3089 Other allergic rhinitis: Secondary | ICD-10-CM | POA: Diagnosis not present

## 2018-01-20 DIAGNOSIS — J4530 Mild persistent asthma, uncomplicated: Secondary | ICD-10-CM | POA: Diagnosis not present

## 2018-02-08 ENCOUNTER — Encounter: Payer: Self-pay | Admitting: Family Medicine

## 2018-02-08 ENCOUNTER — Ambulatory Visit: Payer: BLUE CROSS/BLUE SHIELD | Admitting: Family Medicine

## 2018-02-08 VITALS — BP 112/68 | HR 77 | Temp 98.5°F | Ht 62.75 in | Wt 176.2 lb

## 2018-02-08 DIAGNOSIS — B9789 Other viral agents as the cause of diseases classified elsewhere: Secondary | ICD-10-CM | POA: Diagnosis not present

## 2018-02-08 DIAGNOSIS — J069 Acute upper respiratory infection, unspecified: Secondary | ICD-10-CM | POA: Diagnosis not present

## 2018-02-08 DIAGNOSIS — Z23 Encounter for immunization: Secondary | ICD-10-CM

## 2018-02-08 MED ORDER — BENZONATATE 200 MG PO CAPS: 200.0000 mg | ORAL_CAPSULE | Freq: Three times a day (TID) | ORAL | 1 refills | Status: DC | PRN

## 2018-02-08 MED ORDER — HYDROCODONE-HOMATROPINE 5-1.5 MG/5ML PO SYRP: ORAL_SOLUTION | ORAL | 0 refills | Status: DC

## 2018-02-08 NOTE — Assessment & Plan Note (Signed)
Reassuring exam with no wheezing  Refilled hycodan for pm use  Tessalon tid prn cough  Fluids/rest  For congestion/rhinorrhea and drip- inst to re start flonase and antihistamine (xyzal) and singulair Update if not starting to improve in a week or if worsening  (esp if wheezing or fever or facial pain)

## 2018-02-08 NOTE — Progress Notes (Signed)
Subjective:    Patient ID: April Daniels, female    DOB: 21-Sep-1975, 43 y.o.   MRN: [id number]  HPI  Here for 2 weeks of cough   Has stomach bug in household over holidays She got diarrhea 2-3 days (no n/v)   Since then she has had a pm cough (occ mildly productive -mostly dry)  Wakes her up  Hard to sleep   Not coughing during the day - except if she eats or drinks may trigger a cough   Some drainage and nasal congestion - mild  Mucous has been green  No sinus pain   No wheezing  Inhaler does not help cough   Elevated temp in the very beginning   Otc: nyquil  Had left over hycodan from a year ago (has quite a bit left)-this helps a lot  She finished it    She takes singulair and either zyrtec or xyzal px  flonase - not every single day   Patient Active Problem List   Diagnosis Date Noted  . Viral URI with cough 02/08/2018  . Elevated BP 03/14/2013  . DERMATOPHYTOSIS OF NAIL 11/30/2007  . ALLERGIC RHINITIS 01/25/2007  . Asthma 01/25/2007   Past Medical History:  Diagnosis Date  . Allergy-induced asthma   . Asthma    allergy induced  . GERD (gastroesophageal reflux disease)    with pregnancy  . Mitral valve prolapse 1997   Past Surgical History:  Procedure Laterality Date  . APPENDECTOMY    . CESAREAN SECTION    . CESAREAN SECTION  11/06/2010   Procedure: CESAREAN SECTION;  Surgeon: Hollie Salk C. [name], MD;  Location: [facility];  Service: Gynecology;  Laterality: N/A;   Social History   Tobacco Use  . Smoking status: Former Smoker    Types: Cigarettes    Last attempt to quit: 08/14/2006    Years since quitting: 11.4  . Smokeless tobacco: Never Used  Substance Use Topics  . Alcohol use: No  . Drug use: No   Family History  Problem Relation Age of Onset  . Diabetes Father   . Hypertension Mother   . Graves' disease Mother   . Hypertension Brother   . Breast cancer Neg Hx    Allergies  Allergen Reactions  . Ortho Tri-Cyclen [Norgestimate-Eth  Estradiol]     Raised blood pressure  . Sulfamethoxazole-Trimethoprim     REACTION: GI upset   Current Outpatient Medications on File Prior to Visit  Medication Sig Dispense Refill  . albuterol (PROAIR HFA) 108 (90 BASE) MCG/ACT inhaler Inhale into the lungs every 6 (six) hours as needed for wheezing or shortness of breath.    . fluticasone (FLONASE) 50 MCG/ACT nasal spray Place 2 sprays into the nose daily as needed.     Marland Kitchen levocetirizine (XYZAL) 5 MG tablet Take 5 mg by mouth every evening.    . montelukast (SINGULAIR) 10 MG tablet Take 10 mg by mouth every evening.   6   No current facility-administered medications on file prior to visit.     Review of Systems  Constitutional: Positive for appetite change and fatigue. Negative for chills, diaphoresis and fever.  HENT: Positive for congestion, postnasal drip, rhinorrhea, sinus pressure, sneezing and sore throat. Negative for ear pain, trouble swallowing and voice change.   Eyes: Negative for pain and discharge.  Respiratory: Positive for cough. Negative for chest tightness, shortness of breath, wheezing and stridor.   Cardiovascular: Negative for chest pain.  Gastrointestinal: Negative for diarrhea, nausea  and vomiting.  Genitourinary: Negative for frequency, hematuria and urgency.  Musculoskeletal: Negative for arthralgias and myalgias.  Skin: Negative for rash.  Neurological: Positive for headaches. Negative for dizziness, weakness and light-headedness.  Psychiatric/Behavioral: Negative for confusion and dysphoric mood.       Objective:   Physical Exam Constitutional:      General: She is not in acute distress.    Appearance: Normal appearance. She is well-developed. She is obese. She is not ill-appearing, toxic-appearing or diaphoretic.  HENT:     Head: Normocephalic and atraumatic.     Comments: Nares are injected and congested      Right Ear: Tympanic membrane, ear canal and external ear normal.     Left Ear: Tympanic  membrane, ear canal and external ear normal.     Nose: Congestion and rhinorrhea present.     Mouth/Throat:     Mouth: Mucous membranes are moist.     Pharynx: Oropharynx is clear. No oropharyngeal exudate or posterior oropharyngeal erythema.     Comments: Clear pnd  Eyes:     General:        Right eye: No discharge.        Left eye: No discharge.     Conjunctiva/sclera: Conjunctivae normal.     Pupils: Pupils are equal, round, and reactive to light.  Neck:     Musculoskeletal: Normal range of motion and neck supple.  Cardiovascular:     Rate and Rhythm: Normal rate.     Heart sounds: Normal heart sounds.  Pulmonary:     Effort: Pulmonary effort is normal. No respiratory distress.     Breath sounds: Normal breath sounds. No stridor. No wheezing, rhonchi or rales.     Comments: Good air exch No wheeze even on forced expiration  No rales or rhonchi  Dry cough Some upper airway sounds Chest:     Chest wall: No tenderness.  Lymphadenopathy:     Cervical: No cervical adenopathy.  Skin:    General: Skin is warm and dry.     Capillary Refill: Capillary refill takes less than 2 seconds.     Findings: No rash.  Neurological:     Mental Status: She is alert.     Cranial Nerves: No cranial nerve deficit.  Psychiatric:        Mood and Affect: Mood normal.           Assessment & Plan:   Problem List Items Addressed This Visit      Respiratory   Viral URI with cough - Primary    Reassuring exam with no wheezing  Refilled hycodan for pm use  Tessalon tid prn cough  Fluids/rest  For congestion/rhinorrhea and drip- inst to re start flonase and antihistamine (xyzal) and singulair Update if not starting to improve in a week or if worsening  (esp if wheezing or fever or facial pain)

## 2018-02-08 NOTE — Patient Instructions (Signed)
Stay on allergy medicines daily   Take tessalon three times daily  Hycodan at night- caution of sedation   Update if not starting to improve in a week or if worsening   Especially if facial pain /fever or worse cough   Fluids and rest

## 2018-02-08 NOTE — Addendum Note (Signed)
Addended by: Shon Millet on: 02/08/2018 04:31 PM   Modules accepted: Orders

## 2018-02-27 DIAGNOSIS — R509 Fever, unspecified: Secondary | ICD-10-CM | POA: Diagnosis not present

## 2018-04-05 ENCOUNTER — Ambulatory Visit: Payer: Self-pay

## 2018-04-05 ENCOUNTER — Other Ambulatory Visit: Payer: Self-pay | Admitting: Family Medicine

## 2018-04-05 DIAGNOSIS — Z1231 Encounter for screening mammogram for malignant neoplasm of breast: Secondary | ICD-10-CM

## 2018-04-05 NOTE — Telephone Encounter (Signed)
Pt. Reports she was told years ago she has mitral valve prolapse.States she has not had any problems. Reports she donated blood last Wed. And started noticing shortness of breath over the weekend. Denies chest pain. Did have palpitations over the weekend as well. Appointment made for tomorrow. No availability today per [facility]. Instructed to go to ED for worsening of symptoms. Verbalizes understanding.  Reason for Disposition . [1] MODERATE longstanding difficulty breathing (e.g., speaks in phrases, SOB even at rest, pulse 100-120) AND [2] SAME as normal  Answer Assessment - Initial Assessment Questions 1. RESPIRATORY STATUS: "Describe your breathing?" (e.g., wheezing, shortness of breath, unable to speak, severe coughing)      Shortness of breat with minimal exertion 2. ONSET: "When did this breathing problem begin?"       Started Thursday 3. PATTERN "Does the difficult breathing come and go, or has it been constant since it started?"      cOMES AND GOES 4. SEVERITY: "How bad is your breathing?" (e.g., mild, moderate, severe)    - MILD: No SOB at rest, mild SOB with walking, speaks normally in sentences, can lay down, no retractions, pulse < 100.    - MODERATE: SOB at rest, SOB with minimal exertion and prefers to sit, cannot lie down flat, speaks in phrases, mild retractions, audible wheezing, pulse 100-120.    - SEVERE: Very SOB at rest, speaks in single words, struggling to breathe, sitting hunched forward, retractions, pulse > 120      Mild 5. RECURRENT SYMPTOM: "Have you had difficulty breathing before?" If so, ask: "When was the last time?" and "What happened that time?"       No 6. CARDIAC HISTORY: "Do you have any history of heart disease?" (e.g., heart attack, angina, bypass surgery, angioplasty)      Mitral valve prolapse 7. LUNG HISTORY: "Do you have any history of lung disease?"  (e.g., pulmonary embolus, asthma, emphysema)     Allergy induced asthma 8. CAUSE: "What do you think is  causing the breathing problem?"      Gave blood last week 9. OTHER SYMPTOMS: "Do you have any other symptoms? (e.g., dizziness, runny nose, cough, chest pain, fever)     Palpitations  10. PREGNANCY: "Is there any chance you are pregnant?" "When was your last menstrual period?"       No 11. TRAVEL: "Have you traveled out of the country in the last month?" (e.g., travel history, exposures)       No  Protocols used: BREATHING DIFFICULTY-A-AH

## 2018-04-05 NOTE — Telephone Encounter (Signed)
Noted  

## 2018-04-05 NOTE — Telephone Encounter (Signed)
Verlon Au RN also commented; Appointment made for tomorrow. No availability with Dr. Milinda Antis at time pt. Requesting. Thanks.    pt has appt with Harlin Heys FNP 04/06/18 at 12 noon.

## 2018-04-06 ENCOUNTER — Ambulatory Visit: Payer: BLUE CROSS/BLUE SHIELD | Admitting: Family Medicine

## 2018-04-06 ENCOUNTER — Encounter: Payer: Self-pay | Admitting: Family Medicine

## 2018-04-06 VITALS — BP 122/76 | HR 76 | Temp 98.6°F | Ht 62.75 in | Wt 176.0 lb

## 2018-04-06 DIAGNOSIS — J4520 Mild intermittent asthma, uncomplicated: Secondary | ICD-10-CM | POA: Diagnosis not present

## 2018-04-06 DIAGNOSIS — R0600 Dyspnea, unspecified: Secondary | ICD-10-CM

## 2018-04-06 DIAGNOSIS — R0609 Other forms of dyspnea: Secondary | ICD-10-CM

## 2018-04-06 DIAGNOSIS — R002 Palpitations: Secondary | ICD-10-CM

## 2018-04-06 LAB — CBC WITH DIFFERENTIAL/PLATELET
Basophils Absolute: 0 10*3/uL (ref 0.0–0.1)
Basophils Relative: 0.5 % (ref 0.0–3.0)
Eosinophils Absolute: 0.1 10*3/uL (ref 0.0–0.7)
Eosinophils Relative: 1 % (ref 0.0–5.0)
HCT: 30.9 % — ABNORMAL LOW (ref 36.0–46.0)
Hemoglobin: 10 g/dL — ABNORMAL LOW (ref 12.0–15.0)
Lymphocytes Relative: 31.8 % (ref 12.0–46.0)
Lymphs Abs: 2.7 10*3/uL (ref 0.7–4.0)
MCHC: 32.2 g/dL (ref 30.0–36.0)
MCV: 75.7 fl — ABNORMAL LOW (ref 78.0–100.0)
Monocytes Absolute: 0.8 10*3/uL (ref 0.1–1.0)
Monocytes Relative: 9 % (ref 3.0–12.0)
Neutro Abs: 4.9 10*3/uL (ref 1.4–7.7)
Neutrophils Relative %: 57.7 % (ref 43.0–77.0)
Platelets: 353 10*3/uL (ref 150.0–400.0)
RBC: 4.08 Mil/uL (ref 3.87–5.11)
RDW: 17.9 % — ABNORMAL HIGH (ref 11.5–15.5)
WBC: 8.5 10*3/uL (ref 4.0–10.5)

## 2018-04-06 LAB — BASIC METABOLIC PANEL
BUN: 8 mg/dL (ref 6–23)
CO2: 24 mEq/L (ref 19–32)
Calcium: 9.2 mg/dL (ref 8.4–10.5)
Chloride: 104 mEq/L (ref 96–112)
Creatinine, Ser: 0.7 mg/dL (ref 0.40–1.20)
GFR: 92.07 mL/min (ref >60.00)
Glucose, Bld: 84 mg/dL (ref 70–99)
Potassium: 3.8 mEq/L (ref 3.5–5.1)
Sodium: 136 mEq/L (ref 135–145)

## 2018-04-06 LAB — FERRITIN: Ferritin: 4.1 ng/mL — ABNORMAL LOW (ref 10.0–291.0)

## 2018-04-06 LAB — TSH: TSH: 0.62 u[IU]/mL (ref 0.35–4.50)

## 2018-04-06 NOTE — Progress Notes (Signed)
Subjective:    Patient ID: April Daniels, female    DOB: 1975/09/23, 43 y.o.   MRN: [id number]  HPI This is a 43 yo female being seen today for shortness of breath after donating blood 7 days ago. Actual symptoms began 6 days ago with shortness of breath with activity walking around house, had to sit down to catch breath. Two days ago she felt her heart racing (2 episodes), she has been under more stress, feeling more fatigued, denies cough, chest pain, swelling of feet/ankles, nausea/vomiting, no blood in bowel movements or urine, denies aches or pains.  Past Medical History:  Diagnosis Date  . Allergy-induced asthma   . Asthma    allergy induced  . GERD (gastroesophageal reflux disease)    with pregnancy  . Mitral valve prolapse 1997   Past Surgical History:  Procedure Laterality Date  . APPENDECTOMY    . CESAREAN SECTION    . CESAREAN SECTION  11/06/2010   Procedure: CESAREAN SECTION;  Surgeon: Hollie Salk C. [name], MD;  Location: [facility];  Service: Gynecology;  Laterality: N/A;   Family History  Problem Relation Age of Onset  . Diabetes Father   . Hypertension Mother   . Graves' disease Mother   . Hypertension Brother   . Breast cancer Neg Hx    Social History   Tobacco Use  . Smoking status: Former Smoker    Types: Cigarettes    Last attempt to quit: 08/14/2006    Years since quitting: 11.6  . Smokeless tobacco: Never Used  Substance Use Topics  . Alcohol use: No  . Drug use: No      Review of Systems Per HPI    Objective:   Physical Exam Vitals signs reviewed.  Constitutional:      Appearance: Normal appearance. She is well-developed.  HENT:     Head: Normocephalic.  Cardiovascular:     Rate and Rhythm: Normal rate and regular rhythm.     Heart sounds: Murmur present.  Pulmonary:     Effort: Pulmonary effort is normal.     Breath sounds: Decreased air movement present. Examination of the right-lower field reveals decreased breath sounds. Examination of  the left-lower field reveals decreased breath sounds. Decreased breath sounds present.     Comments: Diminished in bases Skin:    General: Skin is warm and dry.  Neurological:     Mental Status: She is alert and oriented to person, place, and time.  Psychiatric:        Attention and Perception: Attention normal.        Mood and Affect: Mood normal.        Behavior: Behavior normal.     BP 122/76   Pulse 76   Temp 98.6 F (37 C) (Oral)   Ht 5' 2.75" (1.594 m)   Wt 176 lb (79.8 kg)   LMP 03/11/2018   SpO2 98%   BMI 31.43 kg/m   BP Readings from Last 3 Encounters:  04/06/18 122/76  02/08/18 112/68  02/17/17 118/88    Wt Readings from Last 3 Encounters:  04/06/18 176 lb (79.8 kg)  02/08/18 176 lb 4 oz (79.9 kg)  02/17/17 186 lb 12 oz (84.7 kg)           Assessment & Plan:  Palpitations - Plan: Ferritin, TSH  DOE (dyspnea on exertion) - Plan: Ferritin, CBC with Differential, Basic Metabolic Panel   Patient Instructions  Good to see you today  Please try using your  albuterol a couple of times a day to see if improved breathing  I will notify you of labs, if symptoms worsen in the mean time, please call the office

## 2018-04-06 NOTE — Patient Instructions (Addendum)
Good to see you today  Please try using your albuterol a couple of times a day to see if improved breathing  I will notify you of labs, if symptoms worsen in the mean time, please call the office

## 2018-04-06 NOTE — Progress Notes (Signed)
Subjective:    Patient ID: April Daniels, female    DOB: 03/04/1975, 42 y.o.   MRN: [id number]  HPI This is a 43 yo female who presents today with shortness of breath x 6 days. She donated blood 7 days ago. She noticed when walking around house and doing normal activity that she was feeling winded, feels a little tight in her chest  In last week has had 2 episodes of palpitations. No chest pian, no orthopnea, no cough, no LE edema. Some increased stress. Had influenza about a month ago with full recovery. No abdominal pain/nausea or vomiting, no hematuria, no blood in BMs. No myalgias/arthralgias. Periods heavy for 2 days. She has a history of asthma and currently is treated with albuterol prn, montelukast and antihistamine. She does not always take montelukast and antihistamine. She was on Qvar years ago but doesn't take anymore. She has not used her albuterol for the current symptoms. She does not exercise regularly. Distant smoking history. Has lost weight with [employer].    Saw Dr. Mariah Milling in the past and diagnosed with mitral valve prolapse.   Past Medical History:  Diagnosis Date  . Allergy-induced asthma   . Asthma    allergy induced  . GERD (gastroesophageal reflux disease)    with pregnancy  . Mitral valve prolapse 1997   Past Surgical History:  Procedure Laterality Date  . APPENDECTOMY    . CESAREAN SECTION    . CESAREAN SECTION  11/06/2010   Procedure: CESAREAN SECTION;  Surgeon: Hollie Salk C. [name], MD;  Location: [facility];  Service: Gynecology;  Laterality: N/A;   Family History  Problem Relation Age of Onset  . Diabetes Father   . Hypertension Mother   . Graves' disease Mother   . Hypertension Brother   . Breast cancer Neg Hx    Social History   Tobacco Use  . Smoking status: Former Smoker    Types: Cigarettes    Last attempt to quit: 08/14/2006    Years since quitting: 11.6  . Smokeless tobacco: Never Used  Substance Use Topics  . Alcohol use: No  . Drug  use: No      Review of Systems Per HPI    Objective:   Physical Exam Vitals signs reviewed.  Constitutional:      General: She is not in acute distress.    Appearance: She is well-developed. She is obese. She is not ill-appearing, toxic-appearing or diaphoretic.  HENT:     Head: Normocephalic.     Mouth/Throat:     Mouth: Mucous membranes are moist.     Pharynx: Oropharynx is clear.  Neck:     Musculoskeletal: Normal range of motion and neck supple.  Cardiovascular:     Rate and Rhythm: Normal rate and regular rhythm.     Heart sounds: Normal heart sounds.  Pulmonary:     Effort: Pulmonary effort is normal.     Breath sounds: Normal breath sounds.     Comments: Peak Flow- predicted 474, actual 340, 75% predicted.  Musculoskeletal:     Right lower leg: No edema.     Left lower leg: No edema.  Skin:    General: Skin is warm and dry.  Neurological:     Mental Status: She is alert and oriented to person, place, and time.  Psychiatric:        Mood and Affect: Mood normal.        Behavior: Behavior normal.  BP 122/76   Pulse 76   Temp 98.6 F (37 C) (Oral)   Ht 5' 2.75" (1.594 m)   Wt 176 lb (79.8 kg)   LMP 03/11/2018   SpO2 98%   BMI 31.43 kg/m  Wt Readings from Last 3 Encounters:  04/06/18 176 lb (79.8 kg)  02/08/18 176 lb 4 oz (79.9 kg)  02/17/17 186 lb 12 oz (84.7 kg)       Assessment & Plan:  1. Palpitations - Ferritin - TSH  2. DOE (dyspnea on exertion) - she has some remote anemia, will check labs, ? Blood donation has increased anemia - Ferritin - CBC with Differential - Basic Metabolic Panel  3. Mild intermittent asthma without complication - peak flow 75% of predicted, may need to restart her daily ICS, will have restart her oral meds and use albuterol to see if improvement of symptoms.   Olean Ree, FNP-BC  [facility], [facility]  04/08/2018 1:41 PM

## 2018-04-08 ENCOUNTER — Encounter: Payer: Self-pay | Admitting: Family Medicine

## 2018-04-13 ENCOUNTER — Other Ambulatory Visit: Payer: Self-pay

## 2018-04-13 ENCOUNTER — Ambulatory Visit
Admission: RE | Admit: 2018-04-13 | Discharge: 2018-04-13 | Disposition: A | Discharge status: Home / Self Care | Payer: BLUE CROSS/BLUE SHIELD | Source: Ambulatory Visit | Attending: Family Medicine | Admitting: Family Medicine

## 2018-04-13 DIAGNOSIS — Z1231 Encounter for screening mammogram for malignant neoplasm of breast: Secondary | ICD-10-CM | POA: Diagnosis not present

## 2018-05-17 ENCOUNTER — Telehealth: Payer: Self-pay | Admitting: Radiology

## 2018-05-17 NOTE — Telephone Encounter (Signed)
Left voicemail for cancellation for annual exam appointment on 05/24/18 with Dr [name], [facility] to call back to reschedule .

## 2018-05-24 ENCOUNTER — Ambulatory Visit: Payer: BLUE CROSS/BLUE SHIELD | Admitting: Obstetrics & Gynecology

## 2019-02-15 ENCOUNTER — Ambulatory Visit: Payer: BC Managed Care – PPO | Attending: Internal Medicine

## 2019-02-15 DIAGNOSIS — Z20822 Contact with and (suspected) exposure to covid-19: Secondary | ICD-10-CM

## 2019-02-17 LAB — NOVEL CORONAVIRUS, NAA: SARS-CoV-2, NAA: NOT DETECTED

## 2019-03-03 ENCOUNTER — Other Ambulatory Visit: Payer: Self-pay | Admitting: Family Medicine

## 2019-03-22 ENCOUNTER — Encounter: Payer: Self-pay | Admitting: Advanced Practice Midwife

## 2019-03-22 ENCOUNTER — Other Ambulatory Visit: Payer: Self-pay

## 2019-03-22 ENCOUNTER — Ambulatory Visit (INDEPENDENT_AMBULATORY_CARE_PROVIDER_SITE_OTHER): Payer: BC Managed Care – PPO | Admitting: Advanced Practice Midwife

## 2019-03-22 VITALS — BP 126/82 | HR 75 | Ht 63.0 in | Wt 198.0 lb

## 2019-03-22 DIAGNOSIS — Z124 Encounter for screening for malignant neoplasm of cervix: Secondary | ICD-10-CM

## 2019-03-22 DIAGNOSIS — Z01419 Encounter for gynecological examination (general) (routine) without abnormal findings: Secondary | ICD-10-CM | POA: Diagnosis not present

## 2019-03-22 DIAGNOSIS — Z1151 Encounter for screening for human papillomavirus (HPV): Secondary | ICD-10-CM | POA: Diagnosis not present

## 2019-03-22 NOTE — Patient Instructions (Signed)

## 2019-03-22 NOTE — Progress Notes (Signed)
GYNECOLOGY ANNUAL PREVENTATIVE CARE ENCOUNTER NOTE  History:     April Daniels is a 44 y.o. G50P2002 female here for a routine annual gynecologic exam.  Current complaints: none.   Denies abnormal vaginal bleeding, discharge, pelvic pain, problems with intercourse or other gynecologic concerns.    Female partner, penetrative sex. Non-smoker. Really enjoys relationship with her children.    Gynecologic History Patient's last menstrual period was 02/22/2019 (approximate). Contraception: none Not trying to conceive but not opposed to pregnancy Last Pap: 12/2016. Results were: normal with negative HPV Last mammogram: 04/2018 Results were: normal  Obstetric History OB History  Gravida Para Term Preterm AB Living  2 2 2  0 0 2  SAB TAB Ectopic Multiple Live Births  0 0 0 0 2    # Outcome Date GA Lbr Len/2nd Weight Sex Delivery Anes PTL Lv  2 Term 11/06/10 [redacted]w[redacted]d  8 lb 8.5 oz (3.87 kg) M CS-LTranv Spinal  LIV     Birth Comments: true knot in cord; no dysmorphic features  1 Term 09/13/07 [redacted]w[redacted]d       LIV     Complications: Pre-eclampsia    Past Medical History:  Diagnosis Date  . Allergy-induced asthma   . Asthma    allergy induced  . GERD (gastroesophageal reflux disease)    with pregnancy  . Mitral valve prolapse 1997    Past Surgical History:  Procedure Laterality Date  . APPENDECTOMY    . CESAREAN SECTION    . CESAREAN SECTION  11/06/2010   Procedure: CESAREAN SECTION;  Surgeon: 01/06/2011 C. Hollie Salk, MD;  Location: [facility];  Service: Gynecology;  Laterality: N/A;    Current Outpatient Medications on File Prior to Visit  Medication Sig Dispense Refill  . albuterol (PROAIR HFA) 108 (90 BASE) MCG/ACT inhaler Inhale into the lungs every 6 (six) hours as needed for wheezing or shortness of breath.    . ferrous sulfate 325 (65 FE) MG EC tablet Take 325 mg by mouth 3 (three) times daily with meals.    . fluticasone (FLONASE) 50 MCG/ACT nasal spray Place 2 sprays into the nose daily  as needed.     April Daniels levocetirizine (XYZAL) 5 MG tablet Take 5 mg by mouth every evening.    . montelukast (SINGULAIR) 10 MG tablet Take 10 mg by mouth every evening.   6   No current facility-administered medications on file prior to visit.    Allergies  Allergen Reactions  . Ortho Tri-Cyclen [Norgestimate-Eth Estradiol]     Raised blood pressure  . Sulfamethoxazole-Trimethoprim     REACTION: GI upset    Social History:  reports that she quit smoking about 12 years ago. Her smoking use included cigarettes. She has never used smokeless tobacco. She reports that she does not drink alcohol or use drugs.  Family History  Problem Relation Age of Onset  . Diabetes Father   . Hypertension Mother   . Graves' disease Mother   . Hypertension Brother   . Breast cancer Neg Hx     The following portions of the patient's history were reviewed and updated as appropriate: allergies, current medications, past family history, past medical history, past social history, past surgical history and problem list.  Review of Systems Pertinent items noted in HPI and remainder of comprehensive ROS otherwise negative.  Physical Exam:  BP 126/82   Pulse 75   Ht 5\' 3"  (1.6 m)   Wt 198 lb (89.8 kg)   LMP 02/22/2019 (Approximate)  BMI 35.07 kg/m  CONSTITUTIONAL: Well-developed, well-nourished female in no acute distress.  HENT:  Normocephalic, atraumatic, External right and left ear normal. Oropharynx is clear and moist EYES: Conjunctivae and EOM are normal. Pupils are equal, round, and reactive to light. No scleral icterus.  NECK: Normal range of motion, supple, no masses.  Normal thyroid.  SKIN: Skin is warm and dry. No rash noted. Not diaphoretic. No erythema. No pallor. MUSCULOSKELETAL: Normal range of motion. No tenderness.  No cyanosis, clubbing, or edema.  2+ distal pulses. NEUROLOGIC: Alert and oriented to person, place, and time. Normal reflexes, muscle tone coordination.  PSYCHIATRIC: Normal  mood and affect. Normal behavior. Normal judgment and thought content. CARDIOVASCULAR: Normal heart rate noted, regular rhythm RESPIRATORY: Clear to auscultation bilaterally. Effort and breath sounds normal, no problems with respiration noted. BREASTS: Symmetric in size. No masses, tenderness, skin changes, nipple drainage, or lymphadenopathy bilaterally. ABDOMEN: Soft, no distention noted.  No tenderness, rebound or guarding.  PELVIC: Normal appearing external genitalia and urethral meatus; normal appearing vaginal mucosa and cervix.  No abnormal discharge noted.  Pap smear obtained.  Normal uterine size, no other palpable masses, no uterine or adnexal tenderness.   Assessment and Plan:    1. Well woman exam with routine gynecological exam - No concerning findings on exam - Cytology - PAP - MM 3D SCREEN BREAST BILATERAL; Future  Will follow up results of pap smear and manage accordingly. Mammogram scheduled Routine preventative health maintenance measures emphasized. Please refer to After Visit Summary for other counseling recommendations.      April Snooks, MSN, CNM Certified Nurse Midwife, [employer] for [employer], [facility] 03/22/19 6:12 PM

## 2019-03-27 LAB — CYTOLOGY - PAP
Adequacy: ABSENT
Comment: NEGATIVE
Diagnosis: NEGATIVE
High risk HPV: NEGATIVE

## 2019-03-29 ENCOUNTER — Encounter: Payer: Self-pay | Admitting: Family Medicine

## 2019-03-29 ENCOUNTER — Ambulatory Visit (INDEPENDENT_AMBULATORY_CARE_PROVIDER_SITE_OTHER): Payer: BC Managed Care – PPO | Admitting: Family Medicine

## 2019-03-29 DIAGNOSIS — J208 Acute bronchitis due to other specified organisms: Secondary | ICD-10-CM | POA: Diagnosis not present

## 2019-03-29 MED ORDER — PREDNISONE 10 MG PO TABS: ORAL_TABLET | ORAL | 0 refills | Status: DC

## 2019-03-29 MED ORDER — BENZONATATE 200 MG PO CAPS: 200.0000 mg | ORAL_CAPSULE | Freq: Three times a day (TID) | ORAL | 1 refills | Status: DC | PRN

## 2019-03-29 MED ORDER — ALBUTEROL SULFATE HFA 108 (90 BASE) MCG/ACT IN AERS: 2.0000 | INHALATION_SPRAY | RESPIRATORY_TRACT | 0 refills | Status: DC | PRN

## 2019-03-29 MED ORDER — AMOXICILLIN-POT CLAVULANATE 875-125 MG PO TABS: 1.0000 | ORAL_TABLET | Freq: Two times a day (BID) | ORAL | 0 refills | Status: DC

## 2019-03-29 NOTE — Progress Notes (Signed)
Virtual Visit via Video Note  I connected with April Daniels on 03/29/19 at 12:00 PM EST by a video enabled telemedicine application and verified that I am speaking with the correct person using two identifiers.  Location: Patient: home Provider: office   I discussed the limitations of evaluation and management by telemedicine and the availability of in person appointments. The patient expressed understanding and agreed to proceed.  Parties involved in encounter  Patient: April Daniels  Provider:  Loura Pardon MD    History of Present Illness: Pt presents with a cough   Husband and son had it also   Started 3 wk ago-dry tickle in throat  Now productive- yellow phlegm (was clear last week)   Off and on in day and worse at night  Wakes up with ST from coughing  Not using inhaler-but this am a little wheeze   No fever  No chills/aches  Feels ok - just tired from not sleeping due to cough   No nasal symptoms  No headache   No GI symptoms  No loss of taste or smell   OTC: she has used some mucinex (plain)  and cold and flu night time tablets  No lozenges    neg covid test in January 02/15/19  Has f/u with Dr April Daniels in a few weeks   Patient Active Problem List   Diagnosis Date Noted  . Acute viral bronchitis 03/29/2019  . Viral URI with cough 02/08/2018  . Elevated BP 03/14/2013  . DERMATOPHYTOSIS OF NAIL 11/30/2007  . ALLERGIC RHINITIS 01/25/2007  . Asthma 01/25/2007   Past Medical History:  Diagnosis Date  . Allergy-induced asthma   . Asthma    allergy induced  . GERD (gastroesophageal reflux disease)    with pregnancy  . Mitral valve prolapse 1997   Past Surgical History:  Procedure Laterality Date  . APPENDECTOMY    . CESAREAN SECTION    . CESAREAN SECTION  11/06/2010   Procedure: CESAREAN SECTION;  Surgeon: Juliene Pina C. Hulan Fray, MD;  Location: [facility];  Service: Gynecology;  Laterality: N/A;   Social History   Tobacco Use  . Smoking status: Former  Smoker    Types: Cigarettes    Quit date: 08/14/2006    Years since quitting: 12.6  . Smokeless tobacco: Never Used  Substance Use Topics  . Alcohol use: No  . Drug use: No   Family History  Problem Relation Age of Onset  . Diabetes Father   . Hypertension Mother   . Graves' disease Mother   . Hypertension Brother   . Breast cancer Neg Hx    Allergies  Allergen Reactions  . Ortho Tri-Cyclen [Norgestimate-Eth Estradiol]     Raised blood pressure  . Sulfamethoxazole-Trimethoprim     REACTION: GI upset   Current Outpatient Medications on File Prior to Visit  Medication Sig Dispense Refill  . cetirizine (ZYRTEC) 10 MG tablet Take 10 mg by mouth daily.    . ferrous sulfate 325 (65 FE) MG EC tablet Take 325 mg by mouth 3 (three) times daily with meals.    . fluticasone (FLONASE) 50 MCG/ACT nasal spray Place 2 sprays into the nose daily as needed.     . montelukast (SINGULAIR) 10 MG tablet Take 10 mg by mouth every evening.   6   No current facility-administered medications on file prior to visit.   Review of Systems  Constitutional: Negative for chills, fever and malaise/fatigue.  HENT: Negative for congestion, ear pain, sinus pain  and sore throat.   Eyes: Negative for blurred vision, discharge and redness.  Respiratory: Positive for cough, sputum production and wheezing. Negative for hemoptysis, shortness of breath and stridor.   Cardiovascular: Negative for chest pain, palpitations and leg swelling.  Gastrointestinal: Negative for abdominal pain, diarrhea, nausea and vomiting.  Musculoskeletal: Negative for myalgias.  Skin: Negative for rash.  Neurological: Negative for dizziness and headaches.     Observations/Objective: Patient appears well, in no distress Weight is baseline  No facial swelling or asymmetry Normal voice-not hoarse and no slurred speech No obvious tremor or mobility impairment Moving neck and UEs normally Able to hear the call well  No wheezing or  shortness of breath during interview  Patient occasionally clears throat, dry sounding cough Talkative and mentally sharp with no cognitive changes No skin changes on face or neck , no rash or pallor Affect is normal    Assessment and Plan: Problem List Items Addressed This Visit      Respiratory   Acute viral bronchitis    With 3 weeks of cough and neg covid test  Some reactive airways   Given timeline-px augmentin  Prednisone taper 30 mg  Tessalon for cough (also adv change to mucinex dm from plain)  Refilled albuterol mdi  inst to call if worse/esp wheezing  Also if any new symptoms like fever or headache Update if not starting to improve in a week or if worsening            Follow Up Instructions: Keep up a good fluic intake Take augmentin and prednisone as directed (prednisone may make you feel hyper and hungry) Use albuterol for wheezing as needed  Try mucinex DM instead of plain mucinex to help suppress cough   Call if any new symptoms like fever or headache Update if not starting to improve in a week or if worsening     I discussed the assessment and treatment plan with the patient. The patient was provided an opportunity to ask questions and all were answered. The patient agreed with the plan and demonstrated an understanding of the instructions.   The patient was advised to call back or seek an in-person evaluation if the symptoms worsen or if the condition fails to improve as anticipated.     Roxy Manns, MD

## 2019-03-29 NOTE — Patient Instructions (Addendum)
Keep up a good fluid intake Take augmentin and prednisone as directed (prednisone may make you feel hyper and hungry) Use albuterol for wheezing as needed  Try mucinex DM instead of plain mucinex to help suppress cough   Call if any new symptoms like fever or headache Update if not starting to improve in a week or if worsening

## 2019-03-29 NOTE — Assessment & Plan Note (Signed)
With 3 weeks of cough and neg covid test  Some reactive airways   Given timeline-px augmentin  Prednisone taper 30 mg  Tessalon for cough (also adv change to mucinex dm from plain)  Refilled albuterol mdi  inst to call if worse/esp wheezing  Also if any new symptoms like fever or headache Update if not starting to improve in a week or if worsening

## 2019-04-24 DIAGNOSIS — J3089 Other allergic rhinitis: Secondary | ICD-10-CM | POA: Diagnosis not present

## 2019-04-24 DIAGNOSIS — J4530 Mild persistent asthma, uncomplicated: Secondary | ICD-10-CM | POA: Diagnosis not present

## 2019-04-24 DIAGNOSIS — J301 Allergic rhinitis due to pollen: Secondary | ICD-10-CM | POA: Diagnosis not present

## 2019-04-24 DIAGNOSIS — H1045 Other chronic allergic conjunctivitis: Secondary | ICD-10-CM | POA: Diagnosis not present

## 2019-05-29 ENCOUNTER — Ambulatory Visit
Admission: RE | Admit: 2019-05-29 | Discharge: 2019-05-29 | Disposition: A | Discharge status: Home / Self Care | Payer: BC Managed Care – PPO | Source: Ambulatory Visit | Attending: Advanced Practice Midwife | Admitting: Advanced Practice Midwife

## 2019-05-29 DIAGNOSIS — Z01419 Encounter for gynecological examination (general) (routine) without abnormal findings: Secondary | ICD-10-CM

## 2019-05-29 DIAGNOSIS — Z1231 Encounter for screening mammogram for malignant neoplasm of breast: Secondary | ICD-10-CM | POA: Diagnosis not present

## 2019-08-04 ENCOUNTER — Ambulatory Visit: Payer: BC Managed Care – PPO | Admitting: Family Medicine

## 2019-08-04 ENCOUNTER — Encounter: Payer: Self-pay | Admitting: Family Medicine

## 2019-08-04 ENCOUNTER — Other Ambulatory Visit: Payer: Self-pay

## 2019-08-04 VITALS — BP 114/76 | HR 73 | Temp 97.1°F | Wt 200.0 lb

## 2019-08-04 DIAGNOSIS — F430 Acute stress reaction: Secondary | ICD-10-CM | POA: Insufficient documentation

## 2019-08-04 DIAGNOSIS — F988 Other specified behavioral and emotional disorders with onset usually occurring in childhood and adolescence: Secondary | ICD-10-CM | POA: Diagnosis not present

## 2019-08-04 MED ORDER — ESCITALOPRAM OXALATE 10 MG PO TABS: 10.0000 mg | ORAL_TABLET | Freq: Every day | ORAL | 3 refills | Status: DC

## 2019-08-04 NOTE — Assessment & Plan Note (Addendum)
Home/family  Also work related  Reviewed stressors/ coping techniques/symptoms/ support sources/ tx options and side effects in detail today  Discussed use of meditation (app) Also discussed importance of self care  Enc her to start counseling through work-she plans to do that  Rev medication options-will try lexapro 10 mg daily  Discussed expectations of SSRI medication including time to effectiveness and mechanism of action, also poss of side effects (early and late)- including mental fuzziness, weight or appetite change, nausea and poss of worse dep or anxiety (even suicidal thoughts)  Pt voiced understanding and will stop med and update if this occurs   Will titrate up if helpful  F/u in 6 weeks or earlier if needed

## 2019-08-04 NOTE — Patient Instructions (Signed)
Try the headspace app Look into counseling through work   Try lexapro 10 mg daily  If any intolerable side effects or if you feel worse- hold it and let us know   Follow up in about 6 weeks

## 2019-08-04 NOTE — Progress Notes (Signed)
Subjective:    Patient ID: April Daniels, female    DOB: 1975-06-19, 44 y.o.   MRN: [id number]  This visit occurred during the SARS-CoV-2 public health emergency.  Safety protocols were in place, including screening questions prior to the visit, additional usage of staff PPE, and extensive cleaning of exam room while observing appropriate contact time as indicated for disinfecting solutions.    HPI Pt presents to discuss mood change /anx and dep symptoms   Wt Readings from Last 3 Encounters:  08/04/19 200 lb (90.7 kg)  03/29/19 196 lb (88.9 kg)  03/22/19 198 lb (89.8 kg)   35.43 kg/m   Stressors-a lot going on Daughter is in therapy  (ADD)- thinks she may be on spectrum /recently dealing with depression Stressful job -continues to get more stressful   Son is ADHD and recently dx autistic-working through medications and therapy  When she gets home- noisy and lot of demands on her  She does not get a break or any peace  Husband is supportive but she has no energy for him (he is a listener)   No time for self care as a rule - but she did sign up for a boot camp exercise program later in July and wants to start a healthy diet program   Does not do meditation-but downloaded some apps  Has headspace -can start   Has a lot of worry  Thinks she has tendencies of ADD/ and husband some of the spectrum characteristics   Symptoms Both depressive and anxiety - perhaps more on the anxious side  Has not done counseling  She can get some sessions for herself through work   She took med for ADD in the distant past - perhaps concerta  It helped but took away her filter and made her talkative  She has been formally tested for ADD in the past   Does not have suicidal thoughts and not hopeless Wants to isolate herself  Somewhat anxious/nervous  No problem sleeping  Appetite is ok also   Mood affects job quite a bit  Drinks a lot of caffeine  Alcohol- very little  No  marijuana    BP Readings from Last 3 Encounters:  08/04/19 114/76  03/22/19 126/82  04/06/18 122/76   Pulse Readings from Last 3 Encounters:  08/04/19 73  03/22/19 75  04/06/18 76    Patient Active Problem List   Diagnosis Date Noted  . ADD (attention deficit disorder) 08/04/2019  . Stress reaction 08/04/2019  . Elevated BP 03/14/2013  . DERMATOPHYTOSIS OF NAIL 11/30/2007  . ALLERGIC RHINITIS 01/25/2007  . Asthma 01/25/2007   Past Medical History:  Diagnosis Date  . Allergy-induced asthma   . Asthma    allergy induced  . GERD (gastroesophageal reflux disease)    with pregnancy  . Mitral valve prolapse 1997   Past Surgical History:  Procedure Laterality Date  . APPENDECTOMY    . CESAREAN SECTION    . CESAREAN SECTION  11/06/2010   Procedure: CESAREAN SECTION;  Surgeon: Hollie Salk C. [name], MD;  Location: [facility];  Service: Gynecology;  Laterality: N/A;   Social History   Tobacco Use  . Smoking status: Former Smoker    Types: Cigarettes    Quit date: 08/14/2006    Years since quitting: 12.9  . Smokeless tobacco: Never Used  Vaping Use  . Vaping Use: Never used  Substance Use Topics  . Alcohol use: No  . Drug use: No   Family History  Problem Relation Age of Onset  . Diabetes Father   . Hypertension Mother   . Graves' disease Mother   . Hypertension Brother   . Breast cancer Neg Hx    Allergies  Allergen Reactions  . Ortho Tri-Cyclen [Norgestimate-Eth Estradiol]     Raised blood pressure  . Sulfamethoxazole-Trimethoprim     REACTION: GI upset   Current Outpatient Medications on File Prior to Visit  Medication Sig Dispense Refill  . albuterol (PROAIR HFA) 108 (90 Base) MCG/ACT inhaler Inhale 2 puffs into the lungs every 4 (four) hours as needed for wheezing or shortness of breath. 18 g 0  . cetirizine (ZYRTEC) 10 MG tablet Take 10 mg by mouth daily.    . ferrous sulfate 325 (65 FE) MG EC tablet Take 325 mg by mouth 3 (three) times daily with meals.    .  fluticasone (FLONASE) 50 MCG/ACT nasal spray Place 2 sprays into the nose daily as needed.     . montelukast (SINGULAIR) 10 MG tablet Take 10 mg by mouth every evening.   6   No current facility-administered medications on file prior to visit.    Review of Systems  Constitutional: Positive for fatigue. Negative for activity change, appetite change, fever and unexpected weight change.  HENT: Negative for congestion, ear pain, rhinorrhea, sinus pressure and sore throat.   Eyes: Negative for pain, redness and visual disturbance.  Respiratory: Negative for cough, shortness of breath and wheezing.   Cardiovascular: Negative for chest pain and palpitations.  Gastrointestinal: Negative for abdominal pain, blood in stool, constipation and diarrhea.  Endocrine: Negative for polydipsia and polyuria.  Genitourinary: Negative for dysuria, frequency and urgency.  Musculoskeletal: Negative for arthralgias, back pain and myalgias.  Skin: Negative for pallor and rash.  Allergic/Immunologic: Negative for environmental allergies.  Neurological: Negative for dizziness, syncope and headaches.  Hematological: Negative for adenopathy. Does not bruise/bleed easily.  Psychiatric/Behavioral: Positive for decreased concentration and dysphoric mood. Negative for self-injury and suicidal ideas. The patient is nervous/anxious.        Objective:   Physical Exam Constitutional:      General: She is not in acute distress.    Appearance: Normal appearance. She is obese. She is not ill-appearing.  HENT:     Mouth/Throat:     Mouth: Mucous membranes are moist.     Pharynx: Oropharynx is clear.  Eyes:     General: No scleral icterus.    Conjunctiva/sclera: Conjunctivae normal.     Pupils: Pupils are equal, round, and reactive to light.  Cardiovascular:     Rate and Rhythm: Normal rate and regular rhythm.     Heart sounds: Normal heart sounds.  Pulmonary:     Effort: Pulmonary effort is normal. No respiratory  distress.     Breath sounds: Normal breath sounds. No wheezing or rales.  Musculoskeletal:     Cervical back: Normal range of motion and neck supple.  Lymphadenopathy:     Cervical: No cervical adenopathy.  Skin:    General: Skin is warm and dry.     Coloration: Skin is not pale.  Neurological:     Mental Status: She is alert.     Sensory: No sensory deficit.     Motor: No tremor.     Coordination: Coordination normal.     Deep Tendon Reflexes: Reflexes normal.  Psychiatric:        Mood and Affect: Mood normal.           Assessment & Plan:  Problem List Items Addressed This Visit      Other   ADD (attention deficit disorder)    Pt took medication in the past  May be worsened by anxiety/stress or vice versa      Stress reaction    Home/family  Also work related  Reviewed stressors/ coping techniques/symptoms/ support sources/ tx options and side effects in detail today  Discussed use of meditation (app) Also discussed importance of self care  Enc her to start counseling through work-she plans to do that  Rev medication options-will try lexapro 10 mg daily  Discussed expectations of SSRI medication including time to effectiveness and mechanism of action, also poss of side effects (early and late)- including mental fuzziness, weight or appetite change, nausea and poss of worse dep or anxiety (even suicidal thoughts)  Pt voiced understanding and will stop med and update if this occurs   Will titrate up if helpful  F/u in 6 weeks or earlier if needed        Relevant Medications   escitalopram (LEXAPRO) 10 MG tablet

## 2019-08-07 NOTE — Assessment & Plan Note (Signed)
Pt took medication in the past  May be worsened by anxiety/stress or vice versa

## 2019-09-15 ENCOUNTER — Ambulatory Visit: Payer: BC Managed Care – PPO | Admitting: Family Medicine

## 2019-09-25 ENCOUNTER — Other Ambulatory Visit: Payer: Self-pay

## 2019-09-25 ENCOUNTER — Encounter: Payer: Self-pay | Admitting: Family Medicine

## 2019-09-25 ENCOUNTER — Ambulatory Visit: Payer: BC Managed Care – PPO | Admitting: Family Medicine

## 2019-09-25 VITALS — BP 126/68 | HR 87 | Temp 97.5°F | Ht 63.0 in | Wt 203.5 lb

## 2019-09-25 DIAGNOSIS — F430 Acute stress reaction: Secondary | ICD-10-CM | POA: Diagnosis not present

## 2019-09-25 MED ORDER — ESCITALOPRAM OXALATE 20 MG PO TABS: 20.0000 mg | ORAL_TABLET | Freq: Every day | ORAL | 3 refills | Status: DC

## 2019-09-25 NOTE — Patient Instructions (Addendum)
Take care of yourself   Keep doing your exercise program   Go up to 20 mg on the lexapro If any issues/side effects or problems   Follow up as needed

## 2019-09-25 NOTE — Progress Notes (Signed)
Subjective:    Patient ID: April Daniels, female    DOB: May 26, 1975, 44 y.o.   MRN: [id number]  This visit occurred during the SARS-CoV-2 public health emergency.  Safety protocols were in place, including screening questions prior to the visit, additional usage of staff PPE, and extensive cleaning of exam room while observing appropriate contact time as indicated for disinfecting solutions.    HPI Pt presents for f/u of chronic medical problems   Wt Readings from Last 3 Encounters:  09/25/19 203 lb 8 oz (92.3 kg)  08/04/19 200 lb (90.7 kg)  03/29/19 196 lb (88.9 kg)   36.05 kg/m  Last time we started her on lexapro 10 mg  Thinks it is helping some with anxiety   High stress (still)  Husband had foot surgery/broke his foot -cannot go back to work until October  Kids have not started school yet  Very busy at home  Has not been to the breaking point yet   Still dealing with her kid's ADHD - making a plan   Wants to go up on lexapro to 20 mg   Unsure if concentration has improved yet   Going to exercise classes Taking baths -for herself   Not doing counseling  Meditation- bathtub and music for that   Is covid immunized   Patient Active Problem List   Diagnosis Date Noted  . ADD (attention deficit disorder) 08/04/2019  . Stress reaction 08/04/2019  . Elevated BP 03/14/2013  . DERMATOPHYTOSIS OF NAIL 11/30/2007  . ALLERGIC RHINITIS 01/25/2007  . Asthma 01/25/2007   Past Medical History:  Diagnosis Date  . Allergy-induced asthma   . Asthma    allergy induced  . GERD (gastroesophageal reflux disease)    with pregnancy  . Mitral valve prolapse 1997   Past Surgical History:  Procedure Laterality Date  . APPENDECTOMY    . CESAREAN SECTION    . CESAREAN SECTION  11/06/2010   Procedure: CESAREAN SECTION;  Surgeon: Hollie Salk C. [name], MD;  Location: [facility];  Service: Gynecology;  Laterality: N/A;   Social History   Tobacco Use  . Smoking status: Former Smoker      Types: Cigarettes    Quit date: 08/14/2006    Years since quitting: 13.1  . Smokeless tobacco: Never Used  Vaping Use  . Vaping Use: Never used  Substance Use Topics  . Alcohol use: No  . Drug use: No   Family History  Problem Relation Age of Onset  . Diabetes Father   . Hypertension Mother   . Graves' disease Mother   . Hypertension Brother   . Breast cancer Neg Hx    Allergies  Allergen Reactions  . Ortho Tri-Cyclen [Norgestimate-Eth Estradiol]     Raised blood pressure  . Sulfamethoxazole-Trimethoprim     REACTION: GI upset   Current Outpatient Medications on File Prior to Visit  Medication Sig Dispense Refill  . albuterol (PROAIR HFA) 108 (90 Base) MCG/ACT inhaler Inhale 2 puffs into the lungs every 4 (four) hours as needed for wheezing or shortness of breath. 18 g 0  . cetirizine (ZYRTEC) 10 MG tablet Take 10 mg by mouth daily.    . ferrous sulfate 325 (65 FE) MG EC tablet Take 325 mg by mouth 3 (three) times daily with meals.    . fluticasone (FLONASE) 50 MCG/ACT nasal spray Place 2 sprays into the nose daily as needed.     . montelukast (SINGULAIR) 10 MG tablet Take 10 mg by mouth  every evening.   6   No current facility-administered medications on file prior to visit.     Review of Systems  Constitutional: Positive for fatigue. Negative for activity change, appetite change, fever and unexpected weight change.  HENT: Negative for congestion, ear pain, rhinorrhea, sinus pressure and sore throat.   Eyes: Negative for pain, redness and visual disturbance.  Respiratory: Negative for cough, shortness of breath and wheezing.   Cardiovascular: Negative for chest pain and palpitations.  Gastrointestinal: Negative for abdominal pain, blood in stool, constipation and diarrhea.  Endocrine: Negative for polydipsia and polyuria.  Genitourinary: Negative for dysuria, frequency and urgency.  Musculoskeletal: Negative for arthralgias, back pain and myalgias.  Skin: Negative  for pallor and rash.  Allergic/Immunologic: Negative for environmental allergies.  Neurological: Negative for dizziness, syncope and headaches.  Hematological: Negative for adenopathy. Does not bruise/bleed easily.  Psychiatric/Behavioral: Negative for decreased concentration, dysphoric mood and suicidal ideas. The patient is nervous/anxious.        Objective:   Physical Exam Constitutional:      General: She is not in acute distress.    Appearance: Normal appearance. She is obese. She is not ill-appearing or diaphoretic.  Eyes:     General: No scleral icterus.    Extraocular Movements: Extraocular movements intact.     Conjunctiva/sclera: Conjunctivae normal.     Pupils: Pupils are equal, round, and reactive to light.  Neck:     Vascular: No carotid bruit.  Cardiovascular:     Rate and Rhythm: Normal rate and regular rhythm.     Heart sounds: Normal heart sounds.  Pulmonary:     Effort: Pulmonary effort is normal. No respiratory distress.     Breath sounds: No wheezing.  Musculoskeletal:     Cervical back: Normal range of motion and neck supple. No tenderness.  Lymphadenopathy:     Cervical: No cervical adenopathy.  Skin:    General: Skin is warm and dry.     Coloration: Skin is not pale.     Findings: No rash.  Neurological:     Mental Status: She is alert.     Motor: No tremor.     Coordination: Coordination normal.     Deep Tendon Reflexes: Reflexes normal.  Psychiatric:        Attention and Perception: Attention normal.        Mood and Affect: Mood normal.        Thought Content: Thought content normal.        Cognition and Memory: Cognition and memory normal.     Comments: Less anxious today  Normal mood and affect Talks candidly about her stressors and symptoms  Attentive and pleasant             Assessment & Plan:   Problem List Items Addressed This Visit      Other   Stress reaction - Primary    Some improvement with lexapro 10 -able to weather  stress and finding time for herself (exercise and quiet time) Declines need for counseling so far Will inc dose to 20 mg daily  Discussed expectations of SSRI medication including time to effectiveness and mechanism of action, also poss of side effects (early and late)- including mental fuzziness, weight or appetite change, nausea and poss of worse dep or anxiety (even suicidal thoughts)  Pt voiced understanding and will stop med and update if this occurs   Enc strongly to continue self care  Will f/u prn  Relevant Medications   escitalopram (LEXAPRO) 20 MG tablet

## 2019-09-25 NOTE — Assessment & Plan Note (Signed)
Some improvement with lexapro 10 -able to weather stress and finding time for herself (exercise and quiet time) Declines need for counseling so far Will inc dose to 20 mg daily  Discussed expectations of SSRI medication including time to effectiveness and mechanism of action, also poss of side effects (early and late)- including mental fuzziness, weight or appetite change, nausea and poss of worse dep or anxiety (even suicidal thoughts)  Pt voiced understanding and will stop med and update if this occurs   Enc strongly to continue self care  Will f/u prn

## 2020-06-04 ENCOUNTER — Other Ambulatory Visit: Payer: Self-pay | Admitting: *Deleted

## 2020-06-04 ENCOUNTER — Telehealth: Payer: Self-pay | Admitting: Radiology

## 2020-06-04 DIAGNOSIS — Z1231 Encounter for screening mammogram for malignant neoplasm of breast: Secondary | ICD-10-CM

## 2020-06-04 NOTE — Telephone Encounter (Signed)
Left message that mammo order was placed per patient request. Patient stated that she wanted to wait until next year for Annual exam.

## 2020-06-13 ENCOUNTER — Ambulatory Visit
Admission: RE | Admit: 2020-06-13 | Discharge: 2020-06-13 | Disposition: A | Discharge status: Home / Self Care | Payer: BC Managed Care – PPO | Source: Ambulatory Visit | Attending: Advanced Practice Midwife | Admitting: Advanced Practice Midwife

## 2020-06-13 ENCOUNTER — Other Ambulatory Visit: Payer: Self-pay

## 2020-06-13 DIAGNOSIS — Z1231 Encounter for screening mammogram for malignant neoplasm of breast: Secondary | ICD-10-CM | POA: Insufficient documentation

## 2020-09-21 ENCOUNTER — Other Ambulatory Visit: Payer: Self-pay | Admitting: Family Medicine

## 2020-12-09 ENCOUNTER — Telehealth: Payer: Self-pay

## 2020-12-09 NOTE — Telephone Encounter (Signed)
[facility] - Client TELEPHONE ADVICE RECORD [facility] Patient Name: April Daniels D Gender: Female DOB: 1975/04/23 Age: 45 Y 11 M 30 D Return Phone Number: [phone] (Primary) Address: City/ State/ Zip: [place]  [address] Client [facility] - Client Client Site  [facility] - Day Physician [place], Idamae Schuller - MD Contact Type Call Who Is Calling Patient / Member / Family / Caregiver Call Type Triage / Clinical Relationship To Patient Self Return Phone Number [phone] (Primary) Chief Complaint BREATHING - shortness of breath or sounds breathless Reason for Call Symptomatic / Request for Health Information Initial Comment Caller states she had COVID 7 weeks ago. She is now having shortness of breath. Translation No Nurse Assessment Nurse: Gasper Sells, RN, Marylu Lund Date/Time Lamount Cohen Time): 12/09/2020 2:16:46 PM Confirm and document reason for call. If symptomatic, describe symptoms. ---Caller states she had COVID 3 weeks ago. She is now having shortness of breath. Feeling a heaviness in center of chest. Hard time getting a deep breath. 10/21/20 dx. No Paxlovid. Still nasally congested. No fever. 7 weeks since Dx. Does the patient have any new or worsening symptoms? ---Yes Will a triage be completed? ---Yes Related visit to physician within the last 2 weeks? ---No Does the PT have any chronic conditions? (i.e. diabetes, asthma, this includes High risk factors for pregnancy, etc.) ---Yes List chronic conditions. ---allergy meds, singulair, flonase, zyrtec Is the patient pregnant or possibly pregnant? (Ask all females between the ages of 30-55) ---No Is this a behavioral health or substance abuse call? ---No Guidelines Guideline Title Affirmed Question Affirmed Notes Nurse Date/Time Lamount Cohen Time) COVID-19 - Persisting Symptoms Follow-up Call [1] PERSISTING SYMPTOMS OF COVID-19  AND [2] symptoms Rosamaria Lints, RN, Marylu Lund 12/09/2020 2:23:27 PM PLEASE NOTE: All timestamps contained within this report are represented as [place] Standard Time. CONFIDENTIALTY NOTICE: This fax transmission is intended only for the addressee. It contains information that is legally privileged, confidential or otherwise protected from use or disclosure. If you are not the intended recipient, you are strictly prohibited from reviewing, disclosing, copying using or disseminating any of this information or taking any action in reliance on or regarding this information. If you have received this fax in error, please notify us immediately by telephone so that we can arrange for its return to [place]. Phone: [phone], Toll-Free: [phone], Fax: [phone] Page: 2 of 2 Call Id: [id number] Disp. Time Lamount Cohen Time) Disposition Final User 12/09/2020 2:13:12 PM Send to Urgent Queue Young Berry 12/09/2020 2:50:20 PM Called On-Call Provider Gasper Sells, RN, Marylu Lund Reason: worked with nurses to get pt in tomorrw 12/09/2020 2:29:58 PM See PCP within 24 Hours Yes Gasper Sells, RN, Herbert Pun Disagree/Comply Comply Caller Understands Yes PreDisposition Home Care Care Advice Given Per Guideline SEE PCP WITHIN 24 HOURS: * IF OFFICE WILL BE OPEN: You need to be examined within the next 24 hours. Call your doctor (or NP/PA) when the office opens and make an appointment. ALTERNATE DISPOSITION - TELEMEDICINE WITHIN 24 HOURS: * You should call a telemedicine provider within the next 24 hours, if your own doctor (or NP/PA) is not available. CALL BACK IF: * You become worse CARE ADVICE given per COVID-19 - Persisting Symptoms Follow-Up Call (Adult) guideline. After Care Instructions Given Call Event Type User Date / Time Description Education document email Quentin Cornwall 12/09/2020 2:35:13 PM COVID-19 Diagnosed or Suspected Comments User: Jason Coop, RN Date/Time Lamount Cohen Time): 12/09/2020 2:45:40  PM DISCONNECTED and had to call again,  Called RN hotline and advised of week/half of on and off SOB, pressure in chest. User: Jason Coop, RN Date/Time Lamount Cohen Time): 12/09/2020 2:49:43 PM Appt scheduled for 12/10/20, 12:00 pm arrive at 11:45a.m Wear a mask. Seeing Dr at [facility], [address] [place]. PT ADVISED IF ANYTHING GETS WORSE, INCREASED SOB,CP,CONFUSION,FOR PT TO GO STRAIGHT TO ER. Pt voices her understanding. If she has any further questions, call back. Referrals REFERRED TO PCP OFFIC

## 2020-12-09 NOTE — Telephone Encounter (Signed)
Aware, thanks  Will see her then Agree with ER parameters

## 2020-12-09 NOTE — Telephone Encounter (Signed)
I felt the need to speak with the pt with these symptoms; I did speak with pt and she does have tightness and pressure in mid chest that pt has had on and off for 1 1/2 wks ago which was about the time the pt stopped coughing. Pt said she is very congested and 7 wk post covid; pt said heaviness in chest usually last 10' - 45' and then will be gone for couple of hours at least. Pt is not having CP or wheezing.pt said she is in no distress with breathing at this time. Pt said her grandfather had a heart attack when he was a young man and pts father is diabetic. Pt had mitrial valve prolapse when she was in college and saw a cardiologist; pt has not seen card in long time and was told not to worry about the mitral valve prolapse. Pt does not have a fever. Pt has had 2 covid vaccines. Pt prefers not to go to UC or ED. Pt will keep appt with Dr Milinda Antis at [facility] on 12/10/20 at 12 noon but pt will go to ED if needed prior to appt. UC & ED precautions given and pt voiced understanding. Sending note to Dr Milinda Antis, [facility] CMA and will teams [place].

## 2020-12-10 ENCOUNTER — Ambulatory Visit: Payer: BC Managed Care – PPO | Admitting: Family Medicine

## 2020-12-10 ENCOUNTER — Ambulatory Visit (INDEPENDENT_AMBULATORY_CARE_PROVIDER_SITE_OTHER)
Admission: RE | Admit: 2020-12-10 | Discharge: 2020-12-10 | Disposition: A | Discharge status: Home / Self Care | Payer: BC Managed Care – PPO | Source: Ambulatory Visit | Attending: Family Medicine | Admitting: Family Medicine

## 2020-12-10 ENCOUNTER — Other Ambulatory Visit: Payer: Self-pay

## 2020-12-10 ENCOUNTER — Encounter: Payer: Self-pay | Admitting: Family Medicine

## 2020-12-10 VITALS — BP 122/86 | HR 67 | Temp 97.1°F | Ht 63.0 in | Wt 216.0 lb

## 2020-12-10 DIAGNOSIS — J4520 Mild intermittent asthma, uncomplicated: Secondary | ICD-10-CM

## 2020-12-10 DIAGNOSIS — R0789 Other chest pain: Secondary | ICD-10-CM | POA: Insufficient documentation

## 2020-12-10 DIAGNOSIS — R062 Wheezing: Secondary | ICD-10-CM | POA: Diagnosis not present

## 2020-12-10 MED ORDER — PREDNISONE 10 MG PO TABS: ORAL_TABLET | ORAL | 0 refills | Status: DC

## 2020-12-10 NOTE — Assessment & Plan Note (Signed)
Chest pressure 7 weeks after covid  On and off  Nl vitals and exam  Reassuring EKG also  Aware of mvp history  cxr ordered -pend rad review  Prednisone px for reactive airways (wheeze) and chest pressure , this may help any inflammatory change (discussed side eff) Update if not starting to improve in a week or if worsening  ER parameters discussed (sob/cp)

## 2020-12-10 NOTE — Patient Instructions (Signed)
Chest xray today  Use inhaler as needed   Fill pt for prednisone 30 mg taper   Let me know if your symptoms change or worsen  If trouble breathing-go to the ER

## 2020-12-10 NOTE — Progress Notes (Signed)
Subjective:    Patient ID: April Daniels, female    DOB: October 07, 1975, 45 y.o.   MRN: [id number]  This visit occurred during the SARS-CoV-2 public health emergency.  Safety protocols were in place, including screening questions prior to the visit, additional usage of staff PPE, and extensive cleaning of exam room while observing appropriate contact time as indicated for disinfecting solutions.   HPI Pt presents for c/o congestion   Wt Readings from Last 3 Encounters:  12/10/20 216 lb (98 kg)  09/25/19 203 lb 8 oz (92.3 kg)  08/04/19 200 lb (90.7 kg)   38.26 kg/m  Had covid end of sept (caused very bad fatigue) Some body aches with fever for a day  Then she wanted to eat a lot  Was out of work over 2 weeks  Coughed afterwards, worse at night (some production)    Cough is improved , no phlegm  Also some chest tightness on and off  Feels need to take deep breaths  Has a h/o asthma (some wheezing last week)- has not had to use albuterol that much   Nasal congestion is improved She sounds congested  No facial pain  Throat is fine  Ears are fine    Pulse ox is 97% RA with ambulation today  Can do stairs w/o sob at times/few mornings had to stop and catch breath   She has h/o MVP in college and saw cardiology   EKG today NSR rate of 72 Nl PR and QT intervals No ST or T wave changes   BP Readings from Last 3 Encounters:  12/10/20 122/86  09/25/19 126/68  08/04/19 114/76   Pulse Readings from Last 3 Encounters:  12/10/20 67  09/25/19 87  08/04/19 73   Patient Active Problem List   Diagnosis Date Noted   Chest pressure 12/10/2020   ADD (attention deficit disorder) 08/04/2019   Stress reaction 08/04/2019   Elevated BP 03/14/2013   DERMATOPHYTOSIS OF NAIL 11/30/2007   ALLERGIC RHINITIS 01/25/2007   Asthma 01/25/2007   Past Medical History:  Diagnosis Date   Allergy-induced asthma    Asthma    allergy induced   GERD (gastroesophageal reflux disease)     with pregnancy   Mitral valve prolapse 1997   Past Surgical History:  Procedure Laterality Date   APPENDECTOMY     CESAREAN SECTION     CESAREAN SECTION  11/06/2010   Procedure: CESAREAN SECTION;  Surgeon: Hollie Salk C. [name], MD;  Location: [facility];  Service: Gynecology;  Laterality: N/A;   Social History   Tobacco Use   Smoking status: Former    Types: Cigarettes    Quit date: 08/14/2006    Years since quitting: 14.3   Smokeless tobacco: Never  Vaping Use   Vaping Use: Never used  Substance Use Topics   Alcohol use: No   Drug use: No   Family History  Problem Relation Age of Onset   Diabetes Father    Hypertension Mother    Luiz Blare' disease Mother    Hypertension Brother    Breast cancer Neg Hx    Allergies  Allergen Reactions   Ortho Tri-Cyclen [Norgestimate-Eth Estradiol]     Raised blood pressure   Sulfamethoxazole-Trimethoprim     REACTION: GI upset   Current Outpatient Medications on File Prior to Visit  Medication Sig Dispense Refill   albuterol (PROAIR HFA) 108 (90 Base) MCG/ACT inhaler Inhale 2 puffs into the lungs every 4 (four) hours as needed for wheezing or  shortness of breath. 18 g 0   cetirizine (ZYRTEC) 10 MG tablet Take 10 mg by mouth daily.     escitalopram (LEXAPRO) 20 MG tablet TAKE 1 TABLET BY MOUTH ONCE DAILY 90 tablet 3   ferrous sulfate 325 (65 FE) MG EC tablet Take 325 mg by mouth 3 (three) times daily with meals.     fluticasone (FLONASE) 50 MCG/ACT nasal spray Place 2 sprays into the nose daily as needed.      montelukast (SINGULAIR) 10 MG tablet Take 10 mg by mouth every evening.   6   No current facility-administered medications on file prior to visit.      Review of Systems  Constitutional:  Negative for activity change, appetite change, fatigue, fever and unexpected weight change.  HENT:  Negative for congestion, ear pain, rhinorrhea, sinus pressure and sore throat.        Congestion is improved  Eyes:  Negative for pain, redness and visual  disturbance.  Respiratory:  Negative for cough, shortness of breath and wheezing.        No longer coughing  Cardiovascular:  Negative for chest pain, palpitations and leg swelling.       Occasional mid chest pressure  Comes and goes   Gastrointestinal:  Negative for abdominal pain, blood in stool, constipation and diarrhea.  Endocrine: Negative for polydipsia and polyuria.  Genitourinary:  Negative for dysuria, frequency and urgency.  Musculoskeletal:  Negative for arthralgias, back pain and myalgias.  Skin:  Negative for pallor and rash.  Allergic/Immunologic: Negative for environmental allergies.  Neurological:  Negative for dizziness, syncope and headaches.  Hematological:  Negative for adenopathy. Does not bruise/bleed easily.  Psychiatric/Behavioral:  Negative for decreased concentration and dysphoric mood. The patient is not nervous/anxious.       Objective:   Physical Exam Constitutional:      General: She is not in acute distress.    Appearance: She is well-developed. She is obese.  HENT:     Head: Normocephalic and atraumatic.     Right Ear: Tympanic membrane, ear canal and external ear normal.     Left Ear: Tympanic membrane, ear canal and external ear normal.     Nose:     Comments: Nares are boggy    Mouth/Throat:     Mouth: Mucous membranes are moist.  Eyes:     Conjunctiva/sclera: Conjunctivae normal.     Pupils: Pupils are equal, round, and reactive to light.  Neck:     Thyroid: No thyromegaly.     Vascular: No carotid bruit or JVD.  Cardiovascular:     Rate and Rhythm: Normal rate and regular rhythm.     Heart sounds: Normal heart sounds.    No gallop.  Pulmonary:     Effort: Pulmonary effort is normal. No respiratory distress.     Breath sounds: Normal breath sounds. No stridor. No wheezing, rhonchi or rales.     Comments: No chest wall tenderness Chest:     Chest wall: No tenderness.  Abdominal:     General: Bowel sounds are normal. There is no  distension or abdominal bruit.     Palpations: Abdomen is soft. There is no mass.     Tenderness: There is no abdominal tenderness. There is no guarding or rebound.  Musculoskeletal:     Cervical back: Normal range of motion and neck supple.     Right lower leg: No edema.     Left lower leg: No edema.  Lymphadenopathy:  Cervical: No cervical adenopathy.  Skin:    General: Skin is warm and dry.     Coloration: Skin is not jaundiced or pale.     Findings: No bruising or rash.  Neurological:     Mental Status: She is alert.     Coordination: Coordination normal.     Deep Tendon Reflexes: Reflexes are normal and symmetric. Reflexes normal.  Psychiatric:        Mood and Affect: Mood normal.          Assessment & Plan:   Problem List Items Addressed This Visit       Respiratory   Asthma    occ wheeze s/p covid cxr ordered to r/u infiltrate Prednisone 30 mg taper ordered  Disc side eff Update if not starting to improve in a week or if worsening        Relevant Medications   predniSONE (DELTASONE) 10 MG tablet   Other Relevant Orders   DG Chest 2 View     Other   Chest pressure - Primary    Chest pressure 7 weeks after covid  On and off  Nl vitals and exam  Reassuring EKG also  Aware of mvp history  cxr ordered -pend rad review  Prednisone px for reactive airways (wheeze) and chest pressure , this may help any inflammatory change (discussed side eff) Update if not starting to improve in a week or if worsening  ER parameters discussed (sob/cp)          Relevant Orders   EKG 12-Lead (Completed)   DG Chest 2 View

## 2020-12-10 NOTE — Assessment & Plan Note (Signed)
occ wheeze s/p covid cxr ordered to r/u infiltrate Prednisone 30 mg taper ordered  Disc side eff Update if not starting to improve in a week or if worsening

## 2021-04-08 DIAGNOSIS — J4530 Mild persistent asthma, uncomplicated: Secondary | ICD-10-CM | POA: Diagnosis not present

## 2021-04-08 DIAGNOSIS — J3089 Other allergic rhinitis: Secondary | ICD-10-CM | POA: Diagnosis not present

## 2021-04-08 DIAGNOSIS — J301 Allergic rhinitis due to pollen: Secondary | ICD-10-CM | POA: Diagnosis not present

## 2021-04-08 DIAGNOSIS — H1045 Other chronic allergic conjunctivitis: Secondary | ICD-10-CM | POA: Diagnosis not present

## 2021-09-17 ENCOUNTER — Ambulatory Visit: Payer: BC Managed Care – PPO | Admitting: Nurse Practitioner

## 2021-09-17 VITALS — BP 120/86 | HR 74 | Temp 97.0°F | Resp 12 | Ht 63.0 in | Wt 180.4 lb

## 2021-09-17 DIAGNOSIS — R051 Acute cough: Secondary | ICD-10-CM | POA: Diagnosis not present

## 2021-09-17 DIAGNOSIS — J069 Acute upper respiratory infection, unspecified: Secondary | ICD-10-CM | POA: Diagnosis not present

## 2021-09-17 MED ORDER — GUAIFENESIN-CODEINE 100-10 MG/5ML PO SOLN: 5.0000 mL | Freq: Every evening | ORAL | 0 refills | Status: AC | PRN

## 2021-09-17 MED ORDER — AZITHROMYCIN 250 MG PO TABS: ORAL_TABLET | ORAL | 0 refills | Status: AC

## 2021-09-17 MED ORDER — BENZONATATE 100 MG PO CAPS: 100.0000 mg | ORAL_CAPSULE | Freq: Two times a day (BID) | ORAL | 0 refills | Status: DC | PRN

## 2021-09-17 NOTE — Assessment & Plan Note (Signed)
Given length of illness and physical exam will like to treat with azithromycin 250 mg pack as directed.  Follow-up if no improvement.  Patient has wheezing or chest tightness she can use her albuterol inhaler as needed is done in the past.

## 2021-09-17 NOTE — Assessment & Plan Note (Signed)
We will give patient Tessalon Perles 100 mg twice daily use of the day and codeine-guaifenesin cough syrup 5 mils nightly as needed.  Sedation precautions reviewed.

## 2021-09-17 NOTE — Patient Instructions (Signed)
Nice to see you today I have sent 3 prescriptions to your pharmacy Follow up if you do not improve or your symptoms get worse

## 2021-09-17 NOTE — Progress Notes (Signed)
Acute Office Visit  Subjective:     Patient ID: April Daniels, female    DOB: 09/15/1975, 46 y.o.   MRN: [id number]  Chief Complaint  Patient presents with   Cough    X 2 weeks, at first was a dry cough, irritating at first and then became productive. The past 4 days especially at night the cough is keeping patient awake. Has taking Mucinex, takes Zyrtec and Singulair daily. Has used Albuterol inhaler some with not much of a relief. Uses Flonase also.     Patient is in today for cough  Started approx 2 weeks ago. States that she feels ok. States started as a tickle/dry cough. Was intermittent through the day. States in the past 3-4 days it has been bothering her at night. The cough is keeping her up. Then cough turned production  Already using zyrtec, flonase, mucinex and albuterol  inhaler. States that it has not worked the best.   States that her daughter had fallen and a few days after she started having sniffles.   Covid vaccines utd, has not gotten the latest booster No covid test at home   Review of Systems  Constitutional:  Positive for malaise/fatigue. Negative for chills and fever.  HENT:  Negative for ear discharge, ear pain, sinus pain and sore throat.        "+" PND  Respiratory:  Positive for cough (Clear). Negative for shortness of breath.   Cardiovascular:  Negative for chest pain.  Gastrointestinal:  Negative for abdominal pain, diarrhea, nausea and vomiting.  Neurological:  Negative for headaches.        Objective:    BP 120/86   Pulse 74   Temp (!) 97 F (36.1 C)   Resp 12   Ht 5\' 3"  (1.6 m)   Wt 180 lb 6 oz (81.8 kg)   SpO2 98%   BMI 31.95 kg/m    Physical Exam Vitals and nursing note reviewed.  Constitutional:      Appearance: Normal appearance.  HENT:     Right Ear: Tympanic membrane, ear canal and external ear normal.     Left Ear: Tympanic membrane, ear canal and external ear normal.     Nose:     Right Sinus: No maxillary  sinus tenderness or frontal sinus tenderness.     Left Sinus: No maxillary sinus tenderness or frontal sinus tenderness.     Mouth/Throat:     Mouth: Mucous membranes are moist.     Pharynx: Oropharynx is clear.  Cardiovascular:     Rate and Rhythm: Normal rate and regular rhythm.     Heart sounds: Normal heart sounds.  Pulmonary:     Effort: Pulmonary effort is normal.     Breath sounds: Normal breath sounds.  Lymphadenopathy:     Cervical: No cervical adenopathy.  Neurological:     Mental Status: She is alert.     No results found for any visits on 09/17/21.      Assessment & Plan:   Problem List Items Addressed This Visit       Respiratory   Upper respiratory tract infection - Primary    Given length of illness and physical exam will like to treat with azithromycin 250 mg pack as directed.  Follow-up if no improvement.  Patient has wheezing or chest tightness she can use her albuterol inhaler as needed is done in the past.      Relevant Medications   azithromycin (ZITHROMAX) 250 MG tablet  Other   Acute cough    We will give patient Tessalon Perles 100 mg twice daily use of the day and codeine-guaifenesin cough syrup 5 mils nightly as needed.  Sedation precautions reviewed.      Relevant Medications   benzonatate (TESSALON) 100 MG capsule   guaiFENesin-codeine 100-10 MG/5ML syrup    Meds ordered this encounter  Medications   azithromycin (ZITHROMAX) 250 MG tablet    Sig: Take 2 tablets on day 1, then 1 tablet daily on days 2 through 5    Dispense:  6 tablet    Refill:  0    Order Specific Question:   Supervising Provider    Answer:   Roxy Manns A [[id number]]   benzonatate (TESSALON) 100 MG capsule    Sig: Take 1 capsule (100 mg total) by mouth 2 (two) times daily as needed for cough.    Dispense:  20 capsule    Refill:  0    Order Specific Question:   Supervising Provider    Answer:   Milinda Antis MARNE A [[id number]]   guaiFENesin-codeine 100-10 MG/5ML syrup     Sig: Take 5 mLs by mouth at bedtime as needed for up to 10 days for cough.    Dispense:  50 mL    Refill:  0    Order Specific Question:   Supervising Provider    Answer:   Roxy Manns A [[id number]]    Return if symptoms worsen or fail to improve.  Audria Nine, NP

## 2021-10-01 ENCOUNTER — Other Ambulatory Visit: Payer: Self-pay | Admitting: Family Medicine

## 2021-11-06 ENCOUNTER — Other Ambulatory Visit: Payer: Self-pay | Admitting: Family Medicine

## 2021-11-06 NOTE — Telephone Encounter (Signed)
Pt has had a few acute appts but no recent f/u or CPE appts., please call pt and schedule a CPE (labs prior) or at least a med refill f/u appt and then route back to me.

## 2021-11-07 NOTE — Telephone Encounter (Signed)
Patient has been scheduled for a med refill f/u appt.

## 2021-11-17 ENCOUNTER — Ambulatory Visit: Payer: BC Managed Care – PPO | Admitting: Family Medicine

## 2021-11-17 ENCOUNTER — Encounter: Payer: Self-pay | Admitting: Family Medicine

## 2021-11-17 VITALS — BP 114/62 | HR 67 | Temp 97.5°F | Ht 63.0 in | Wt 178.2 lb

## 2021-11-17 DIAGNOSIS — Z1231 Encounter for screening mammogram for malignant neoplasm of breast: Secondary | ICD-10-CM | POA: Diagnosis not present

## 2021-11-17 DIAGNOSIS — F430 Acute stress reaction: Secondary | ICD-10-CM | POA: Diagnosis not present

## 2021-11-17 MED ORDER — ESCITALOPRAM OXALATE 20 MG PO TABS: 20.0000 mg | ORAL_TABLET | Freq: Every day | ORAL | 3 refills | Status: DC

## 2021-11-17 NOTE — Assessment & Plan Note (Signed)
Stress level is down now, in a better job  Also lexapro 20 mg daily continues to help  She would like to continue for now  Reviewed stressors/ coping techniques/symptoms/ support sources/ tx options and side effects in detail today Discussed imp of self care  inst to update if sympt worsen lexapro refilled

## 2021-11-17 NOTE — Progress Notes (Signed)
Subjective:    Patient ID: April Daniels, female    DOB: Mar 08, 1975, 46 y.o.   MRN: [id number]  HPI Pt presents for f/u for med refill for stress reaction /mood   Wt Readings from Last 3 Encounters:  11/17/21 178 lb 4 oz (80.9 kg)  09/17/21 180 lb 6 oz (81.8 kg)  12/10/20 216 lb (98 kg)   31.58 kg/m   Nothing new  Doing ok  At a different job now , [employer] counsel  Really likes it  Less stressful   Has lost weight alos  Started opta via program to start and it went well  Kept it off and trying to loose more   Eats better  A lot less sugar  Drinks lots of water   Exercise- wants to do more  She walked at the park for a while but it got dark  Uses a fitbit  Thought about a home program    Taking lexapro 20 mg daily  Mood is good overall  Wants to stay with it   Has cut back on alcohol   Needs a mammogram   Some cold symptoms at home   Sees Dr Laurens Callas for allergies    BP Readings from Last 3 Encounters:  11/17/21 114/62  09/17/21 120/86  12/10/20 122/86   Pulse Readings from Last 3 Encounters:  11/17/21 67  09/17/21 74  12/10/20 67   Patient Active Problem List   Diagnosis Date Noted   Encounter for screening mammogram for breast cancer 11/17/2021   ADD (attention deficit disorder) 08/04/2019   Stress reaction 08/04/2019   Elevated BP 03/14/2013   DERMATOPHYTOSIS OF NAIL 11/30/2007   ALLERGIC RHINITIS 01/25/2007   Asthma 01/25/2007   Past Medical History:  Diagnosis Date   Allergy-induced asthma    Asthma    allergy induced   GERD (gastroesophageal reflux disease)    with pregnancy   Mitral valve prolapse 1997   Past Surgical History:  Procedure Laterality Date   APPENDECTOMY     CESAREAN SECTION     CESAREAN SECTION  11/06/2010   Procedure: CESAREAN SECTION;  Surgeon: Hollie Salk C. [name], MD;  Location: [facility];  Service: Gynecology;  Laterality: N/A;   Social History   Tobacco Use   Smoking status: Former    Types: Cigarettes     Quit date: 08/14/2006    Years since quitting: 15.2   Smokeless tobacco: Never  Vaping Use   Vaping Use: Never used  Substance Use Topics   Alcohol use: No   Drug use: No   Family History  Problem Relation Age of Onset   Diabetes Father    Hypertension Mother    Luiz Blare' disease Mother    Hypertension Brother    Breast cancer Neg Hx    Allergies  Allergen Reactions   Ortho Tri-Cyclen [Norgestimate-Eth Estradiol]     Raised blood pressure   Sulfamethoxazole-Trimethoprim     REACTION: GI upset   Current Outpatient Medications on File Prior to Visit  Medication Sig Dispense Refill   albuterol (PROAIR HFA) 108 (90 Base) MCG/ACT inhaler Inhale 2 puffs into the lungs every 4 (four) hours as needed for wheezing or shortness of breath. 18 g 0   cetirizine (ZYRTEC) 10 MG tablet Take 10 mg by mouth daily.     ferrous sulfate 325 (65 FE) MG EC tablet Take 325 mg by mouth at bedtime.     fluticasone (FLONASE) 50 MCG/ACT nasal spray Place 2 sprays into  the nose daily as needed.      montelukast (SINGULAIR) 10 MG tablet Take 10 mg by mouth every evening.   6   No current facility-administered medications on file prior to visit.    Review of Systems  Constitutional:  Negative for activity change, appetite change, fatigue, fever and unexpected weight change.  HENT:  Negative for congestion, ear pain, rhinorrhea, sinus pressure and sore throat.   Eyes:  Negative for pain, redness and visual disturbance.  Respiratory:  Negative for cough, shortness of breath and wheezing.   Cardiovascular:  Negative for chest pain and palpitations.  Gastrointestinal:  Negative for abdominal pain, blood in stool, constipation and diarrhea.  Endocrine: Negative for polydipsia and polyuria.  Genitourinary:  Negative for dysuria, frequency and urgency.  Musculoskeletal:  Negative for arthralgias, back pain and myalgias.  Skin:  Negative for pallor and rash.  Allergic/Immunologic: Negative for environmental  allergies.  Neurological:  Negative for dizziness, syncope and headaches.  Hematological:  Negative for adenopathy. Does not bruise/bleed easily.  Psychiatric/Behavioral:  Negative for behavioral problems, confusion, decreased concentration and dysphoric mood. The patient is nervous/anxious.        Objective:   Physical Exam Constitutional:      General: She is not in acute distress.    Appearance: Normal appearance. She is well-developed. She is obese. She is not ill-appearing or diaphoretic.     Comments: Wt loss noted   HENT:     Head: Normocephalic and atraumatic.     Mouth/Throat:     Mouth: Mucous membranes are moist.  Eyes:     Conjunctiva/sclera: Conjunctivae normal.     Pupils: Pupils are equal, round, and reactive to light.  Neck:     Thyroid: No thyromegaly.     Vascular: No carotid bruit or JVD.  Cardiovascular:     Rate and Rhythm: Normal rate and regular rhythm.     Heart sounds: Normal heart sounds.     No gallop.  Pulmonary:     Effort: Pulmonary effort is normal. No respiratory distress.     Breath sounds: Normal breath sounds. No wheezing or rales.  Abdominal:     General: There is no distension or abdominal bruit.     Palpations: Abdomen is soft.  Musculoskeletal:     Cervical back: Normal range of motion and neck supple.     Right lower leg: No edema.     Left lower leg: No edema.  Lymphadenopathy:     Cervical: No cervical adenopathy.  Skin:    General: Skin is warm and dry.     Coloration: Skin is not pale.     Findings: No rash.  Neurological:     Mental Status: She is alert.     Coordination: Coordination normal.     Deep Tendon Reflexes: Reflexes are normal and symmetric. Reflexes normal.  Psychiatric:        Mood and Affect: Mood normal.     Comments: Reviewed stressors/ coping techniques/symptoms/ support sources/ tx options and side effects in detail today Candidly discusses symptoms and stressors , overall doing better              Assessment & Plan:   Problem List Items Addressed This Visit       Other   Encounter for screening mammogram for breast cancer    Overdue for mammogram  Order done for [place]   Disc contraceptive options- she has inc bp with combined OC  Relevant Orders   MM 3D SCREEN BREAST BILATERAL   Stress reaction - Primary    Stress level is down now, in a better job  Also lexapro 20 mg daily continues to help  She would like to continue for now  Reviewed stressors/ coping techniques/symptoms/ support sources/ tx options and side effects in detail today Discussed imp of self care  inst to update if sympt worsen lexapro refilled       Relevant Medications   escitalopram (LEXAPRO) 20 MG tablet

## 2021-11-17 NOTE — Patient Instructions (Addendum)
Look for some indoor exercise options  Get outside when you can   Continue your allergy medicines  Use some nasal saline spray  Watch for facial pain or green nasal discharge   Continue the lexapro   Take care of yourself   Talk to your gyn about contraception options with bp

## 2021-11-17 NOTE — Assessment & Plan Note (Signed)
Normal today  Wt loss helps Cannot take combined OC due to elevated bp

## 2021-11-17 NOTE — Assessment & Plan Note (Signed)
Overdue for mammogram  Order done for April Daniels   Disc contraceptive options- she has inc bp with combined OC

## 2021-12-12 ENCOUNTER — Ambulatory Visit
Admission: RE | Admit: 2021-12-12 | Discharge: 2021-12-12 | Disposition: A | Discharge status: Home / Self Care | Payer: BC Managed Care – PPO | Source: Ambulatory Visit | Attending: Family Medicine | Admitting: Family Medicine

## 2021-12-12 DIAGNOSIS — Z1231 Encounter for screening mammogram for malignant neoplasm of breast: Secondary | ICD-10-CM | POA: Diagnosis not present

## 2021-12-15 ENCOUNTER — Other Ambulatory Visit: Payer: Self-pay | Admitting: Family Medicine

## 2021-12-15 DIAGNOSIS — N63 Unspecified lump in unspecified breast: Secondary | ICD-10-CM

## 2021-12-15 DIAGNOSIS — R928 Other abnormal and inconclusive findings on diagnostic imaging of breast: Secondary | ICD-10-CM

## 2021-12-16 ENCOUNTER — Ambulatory Visit
Admission: RE | Admit: 2021-12-16 | Discharge: 2021-12-16 | Disposition: A | Discharge status: Home / Self Care | Payer: BC Managed Care – PPO | Source: Ambulatory Visit | Attending: Family Medicine | Admitting: Family Medicine

## 2021-12-16 DIAGNOSIS — N63 Unspecified lump in unspecified breast: Secondary | ICD-10-CM | POA: Diagnosis not present

## 2021-12-16 DIAGNOSIS — R92331 Mammographic heterogeneous density, right breast: Secondary | ICD-10-CM | POA: Diagnosis not present

## 2021-12-16 DIAGNOSIS — R928 Other abnormal and inconclusive findings on diagnostic imaging of breast: Secondary | ICD-10-CM

## 2022-04-09 DIAGNOSIS — J301 Allergic rhinitis due to pollen: Secondary | ICD-10-CM | POA: Diagnosis not present

## 2022-04-09 DIAGNOSIS — J4530 Mild persistent asthma, uncomplicated: Secondary | ICD-10-CM | POA: Diagnosis not present

## 2022-04-09 DIAGNOSIS — H1045 Other chronic allergic conjunctivitis: Secondary | ICD-10-CM | POA: Diagnosis not present

## 2022-04-09 DIAGNOSIS — J3089 Other allergic rhinitis: Secondary | ICD-10-CM | POA: Diagnosis not present

## 2022-06-01 ENCOUNTER — Encounter: Payer: Self-pay | Admitting: Family Medicine

## 2022-06-01 ENCOUNTER — Ambulatory Visit: Payer: BC Managed Care – PPO | Admitting: Family Medicine

## 2022-06-01 VITALS — BP 118/82 | HR 78 | Temp 98.2°F | Ht 63.0 in | Wt 187.4 lb

## 2022-06-01 DIAGNOSIS — J4520 Mild intermittent asthma, uncomplicated: Secondary | ICD-10-CM | POA: Diagnosis not present

## 2022-06-01 DIAGNOSIS — J3089 Other allergic rhinitis: Secondary | ICD-10-CM | POA: Diagnosis not present

## 2022-06-01 DIAGNOSIS — J0100 Acute maxillary sinusitis, unspecified: Secondary | ICD-10-CM

## 2022-06-01 DIAGNOSIS — J019 Acute sinusitis, unspecified: Secondary | ICD-10-CM | POA: Insufficient documentation

## 2022-06-01 MED ORDER — AMOXICILLIN-POT CLAVULANATE 875-125 MG PO TABS: 1.0000 | ORAL_TABLET | Freq: Two times a day (BID) | ORAL | 0 refills | Status: AC

## 2022-06-01 MED ORDER — ALBUTEROL SULFATE HFA 108 (90 BASE) MCG/ACT IN AERS: 2.0000 | INHALATION_SPRAY | RESPIRATORY_TRACT | 0 refills | Status: DC | PRN

## 2022-06-01 NOTE — Assessment & Plan Note (Signed)
Anticipate bacterial sinusitis given symptom duration and progression. Rx augmentin course. Continue allergy medications. Add plain mucinex. Update if not improving with treatment.  Pt agrees with plan.

## 2022-06-01 NOTE — Assessment & Plan Note (Signed)
Rx albuterol inhaler (last filled 2021)
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
## 2022-06-01 NOTE — Patient Instructions (Signed)
You have a sinus infection. Take medicine as prescribed: augmentin 10 day course.  Push fluids and plenty of rest. Nasal saline irrigation or neti pot to help drain sinuses.  May use plain mucinex (or fast relief guaifenesin) with plenty of fluid to help mobilize mucous.  Please let us know if fever >101.5, trouble opening/closing mouth, difficulty swallowing, or worsening instead of improving as expected.

## 2022-06-01 NOTE — Assessment & Plan Note (Signed)
Perennial allergic rhinitis followed by allergist.

## 2022-07-10 ENCOUNTER — Encounter: Payer: Self-pay | Admitting: Family Medicine

## 2022-07-10 ENCOUNTER — Ambulatory Visit: Payer: BC Managed Care – PPO | Admitting: Family Medicine

## 2022-07-10 ENCOUNTER — Ambulatory Visit (INDEPENDENT_AMBULATORY_CARE_PROVIDER_SITE_OTHER)
Admission: RE | Admit: 2022-07-10 | Discharge: 2022-07-10 | Disposition: A | Discharge status: Home / Self Care | Payer: BC Managed Care – PPO | Source: Ambulatory Visit | Attending: Family Medicine | Admitting: Family Medicine

## 2022-07-10 VITALS — BP 120/82 | HR 80 | Temp 99.3°F | Resp 16 | Ht 63.0 in | Wt 195.2 lb

## 2022-07-10 DIAGNOSIS — M542 Cervicalgia: Secondary | ICD-10-CM

## 2022-07-10 DIAGNOSIS — S060X0A Concussion without loss of consciousness, initial encounter: Secondary | ICD-10-CM | POA: Insufficient documentation

## 2022-07-10 NOTE — Assessment & Plan Note (Signed)
Acute, symptoms correspond most closely with acute concussion.  She likely returned to work too soon and has had increase in her symptoms due to this.  Normal radiologic exam overall.  No clear indication for head CT but I did discuss with patient warning symptoms for intracranial bleed and reasons to go to the emergency room. Encouraged her to start aggressive brain rest.  Out of work until next Thursday for recuperation time. Close follow-up prior to return next week to assess improvement.
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
## 2022-07-10 NOTE — Patient Instructions (Addendum)
We will call with X-ray results.  Start aggressive brain rest as discussed, add back mental activity slowly once headache completely resolved.  Ibuprofen 600-800 mg every eight hours as needed for neck pain.  Start home PT to stretch out neck .   Go to ER if increasing as opposed to improving headache or new neurologic symptoms.  Remain out of work until follow up appt with PCP
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
## 2022-07-14 ENCOUNTER — Ambulatory Visit: Payer: BC Managed Care – PPO | Admitting: Family Medicine

## 2022-07-14 ENCOUNTER — Encounter: Payer: Self-pay | Admitting: Family Medicine

## 2022-07-14 VITALS — BP 130/81 | HR 74 | Temp 97.8°F | Ht 63.0 in | Wt 195.1 lb

## 2022-07-14 DIAGNOSIS — M542 Cervicalgia: Secondary | ICD-10-CM

## 2022-07-14 DIAGNOSIS — R030 Elevated blood-pressure reading, without diagnosis of hypertension: Secondary | ICD-10-CM

## 2022-07-14 DIAGNOSIS — F430 Acute stress reaction: Secondary | ICD-10-CM

## 2022-07-14 DIAGNOSIS — S0990XD Unspecified injury of head, subsequent encounter: Secondary | ICD-10-CM | POA: Diagnosis not present

## 2022-07-14 DIAGNOSIS — S0990XA Unspecified injury of head, initial encounter: Secondary | ICD-10-CM | POA: Insufficient documentation

## 2022-07-14 DIAGNOSIS — S060X0D Concussion without loss of consciousness, subsequent encounter: Secondary | ICD-10-CM
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
## 2022-07-14 NOTE — Patient Instructions (Addendum)
I want to get a CT of your head to rule out bleeding due to concussion symptoms   I put the referral /order  Please let us know if you don't hear in 1-2 weeks to schedule this    Stay out of work for another week  Observe brain rest for most of the time  Don't try and concentrate for more than 5-10 minutes  If headache or dizziness suddenly worsen, confusion/ personality change or nausea/vomiting-- call us asap and go to ER  Follow up here in about a week
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
## 2022-07-14 NOTE — Assessment & Plan Note (Signed)
Bp is improved on 2nd check today  BP: 130/81  Will continue to monitor

## 2022-07-15 ENCOUNTER — Ambulatory Visit: Payer: BC Managed Care – PPO | Admitting: Family Medicine

## 2022-07-16 ENCOUNTER — Ambulatory Visit
Admission: RE | Admit: 2022-07-16 | Discharge: 2022-07-16 | Disposition: A | Discharge status: Home / Self Care | Payer: BC Managed Care – PPO | Source: Ambulatory Visit | Attending: Family Medicine | Admitting: Family Medicine

## 2022-07-16 DIAGNOSIS — R42 Dizziness and giddiness: Secondary | ICD-10-CM | POA: Diagnosis not present

## 2022-07-16 DIAGNOSIS — S0990XA Unspecified injury of head, initial encounter: Secondary | ICD-10-CM | POA: Diagnosis not present

## 2022-07-16 DIAGNOSIS — R519 Headache, unspecified: Secondary | ICD-10-CM | POA: Diagnosis not present

## 2022-07-16 DIAGNOSIS — S060X0D Concussion without loss of consciousness, subsequent encounter: Secondary | ICD-10-CM

## 2022-07-16 DIAGNOSIS — S0990XD Unspecified injury of head, subsequent encounter: Secondary | ICD-10-CM

## 2022-07-20 ENCOUNTER — Encounter: Payer: Self-pay | Admitting: Family Medicine

## 2022-07-20 ENCOUNTER — Ambulatory Visit: Payer: BC Managed Care – PPO | Admitting: Family Medicine

## 2022-07-20 VITALS — BP 126/80 | HR 73 | Temp 98.2°F | Ht 63.0 in | Wt 193.0 lb

## 2022-07-20 DIAGNOSIS — S060X0D Concussion without loss of consciousness, subsequent encounter: Secondary | ICD-10-CM | POA: Diagnosis not present

## 2022-07-20 NOTE — Assessment & Plan Note (Signed)
Overall making slow progress -tolerates some dull TV/ pictures  CT head was reassuring  Feels ready to try reading /computer work in limited fashion Reassuring exam Will release to work 4 hour shifts starting 6/20 if tolerated , and instructed to rest brain afterwards, with focus on good rest and sleep Note done for work Pt will alert [place] when she feels ready to advance work hours and go back in office Er precautions noted  Instructed to call if symptoms worsen

## 2022-07-20 NOTE — Assessment & Plan Note (Addendum)
Improved symptoms  Now more or less-some stiffness in low back and neck after inactivity  Occational R knee bothers her  Occational soreness Reassuring exam Reassuring CS film   We have held off on PT due to concussion so far  Is interested in chiropractor

## 2022-07-20 NOTE — Patient Instructions (Signed)
Do some stretching / not any inversions where head is lower than body   Continue to rest brain when you can  If headache or other symptoms worsen or change let me know   Try 4 hour work schedule at home starting Thursday and let us know when you want to advance that  You will need to rest your brain after work   Keep [place] posted
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
## 2022-07-21 ENCOUNTER — Encounter: Payer: Self-pay | Admitting: Family Medicine

## 2022-08-09 ENCOUNTER — Encounter: Payer: Self-pay | Admitting: Family Medicine

## 2022-08-10 ENCOUNTER — Telehealth: Payer: Self-pay | Admitting: *Deleted

## 2022-08-10 DIAGNOSIS — M542 Cervicalgia: Secondary | ICD-10-CM | POA: Diagnosis not present

## 2022-08-10 DIAGNOSIS — M5459 Other low back pain: Secondary | ICD-10-CM | POA: Diagnosis not present

## 2022-08-10 NOTE — Telephone Encounter (Signed)
Sent mychart message with PCP's comments

## 2022-08-10 NOTE — Telephone Encounter (Signed)
That's great  .please ask her if she needs a note

## 2022-08-10 NOTE — Telephone Encounter (Signed)
Pt sent a message saying:   I wanted to let you know that I feel that I am now able to go back to work in a full time capacity.  I have let me employer know.  I am much better than I was previously.  I may have an occurrence a day where I get a headache after working on the computer, but it is not nearly at the frequency that it was a couple of weeks ago.  My employer knows the situation and is understanding if I need to walk away from the computer throughout the day.

## 2022-08-25 ENCOUNTER — Ambulatory Visit (INDEPENDENT_AMBULATORY_CARE_PROVIDER_SITE_OTHER)
Admission: RE | Admit: 2022-08-25 | Discharge: 2022-08-25 | Disposition: A | Discharge status: Home / Self Care | Payer: BC Managed Care – PPO | Source: Ambulatory Visit | Attending: Family Medicine | Admitting: Family Medicine

## 2022-08-25 ENCOUNTER — Ambulatory Visit: Payer: BC Managed Care – PPO | Admitting: Family Medicine

## 2022-08-25 ENCOUNTER — Encounter: Payer: Self-pay | Admitting: Family Medicine

## 2022-08-25 VITALS — BP 114/78 | HR 71 | Temp 98.1°F | Ht 63.0 in | Wt 195.5 lb

## 2022-08-25 DIAGNOSIS — M25461 Effusion, right knee: Secondary | ICD-10-CM | POA: Diagnosis not present

## 2022-08-25 DIAGNOSIS — M25561 Pain in right knee: Secondary | ICD-10-CM | POA: Insufficient documentation

## 2022-08-25 DIAGNOSIS — Z1283 Encounter for screening for malignant neoplasm of skin: Secondary | ICD-10-CM | POA: Diagnosis not present

## 2022-08-25 DIAGNOSIS — L988 Other specified disorders of the skin and subcutaneous tissue: Secondary | ICD-10-CM | POA: Diagnosis not present
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
## 2022-08-25 NOTE — Assessment & Plan Note (Signed)
1 cm- regular/ tan around jaw angle on left face Appears benign  Watch for growth or change Ref done to derm for this and skin cancer screen

## 2022-08-25 NOTE — Patient Instructions (Addendum)
Get a neoprene compression sleeve for the knee (with or without a hole) and use when active  Take off when not active It is for support  Use cold compress 10 minutes whenever you can  Avoid activities that hurt  Lower impact things like bike or water exercise are good choice   Get voltraren gel 1% over the counter and use it up to four times daily   Xray today    We will contact you with results   Use sun protection  Will watch spot on face   I put the referral in for dermatology Please let us know if you don't hear in 1-2 weeks
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
## 2022-08-25 NOTE — Assessment & Plan Note (Signed)
Interested in derm referral Encouraged sunscreen use She has lentigines/tags and angiomas /prone to moles

## 2022-08-26 ENCOUNTER — Encounter: Payer: Self-pay | Admitting: Family Medicine
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
## 2022-08-27 ENCOUNTER — Encounter: Payer: Self-pay | Admitting: *Deleted

## 2022-08-27 ENCOUNTER — Encounter: Payer: Self-pay | Admitting: Internal Medicine

## 2022-08-27 ENCOUNTER — Ambulatory Visit: Payer: BC Managed Care – PPO | Admitting: Internal Medicine

## 2022-08-27 VITALS — BP 110/80 | HR 76 | Temp 98.3°F | Ht 63.0 in | Wt 197.0 lb

## 2022-08-27 DIAGNOSIS — M5459 Other low back pain: Secondary | ICD-10-CM | POA: Diagnosis not present

## 2022-08-27 DIAGNOSIS — S060X0S Concussion without loss of consciousness, sequela: Secondary | ICD-10-CM | POA: Diagnosis not present

## 2022-08-27 DIAGNOSIS — M542 Cervicalgia: Secondary | ICD-10-CM | POA: Diagnosis not present

## 2022-08-27 DIAGNOSIS — W102XXA Fall (on)(from) incline, initial encounter: Secondary | ICD-10-CM | POA: Diagnosis not present

## 2022-08-27 DIAGNOSIS — M25561 Pain in right knee: Secondary | ICD-10-CM | POA: Diagnosis not present

## 2022-08-27 NOTE — Assessment & Plan Note (Signed)
Had improved but now slight relapse since this fall No direct hit to head Much milder than before Will have her rest over the weekend and plan to return to work 7/29
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
## 2022-09-02 ENCOUNTER — Encounter: Payer: Self-pay | Admitting: Family Medicine
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
## 2022-09-10 ENCOUNTER — Ambulatory Visit: Payer: BC Managed Care – PPO | Admitting: Family Medicine

## 2022-09-10 ENCOUNTER — Encounter: Payer: Self-pay | Admitting: Family Medicine

## 2022-09-10 VITALS — BP 110/82 | HR 71 | Temp 98.5°F | Ht 63.0 in | Wt 195.4 lb

## 2022-09-10 DIAGNOSIS — D492 Neoplasm of unspecified behavior of bone, soft tissue, and skin: Secondary | ICD-10-CM

## 2022-09-10 DIAGNOSIS — M2391 Unspecified internal derangement of right knee: Secondary | ICD-10-CM

## 2022-09-10 DIAGNOSIS — M2410 Other articular cartilage disorders, unspecified site: Secondary | ICD-10-CM

## 2022-09-10 DIAGNOSIS — M25561 Pain in right knee: Secondary | ICD-10-CM

## 2022-09-11 ENCOUNTER — Encounter: Payer: Self-pay | Admitting: *Deleted

## 2022-09-14 ENCOUNTER — Encounter: Payer: Self-pay | Admitting: Family Medicine

## 2022-09-16 NOTE — Telephone Encounter (Signed)
Left message to return call to our office.  Sending my chart message to call office as well.

## 2022-09-16 NOTE — Telephone Encounter (Signed)
Patient called back states that they are having very mild symptoms. Denied any sob or wheezing. Only having mild headache that tylenol has helped with. Reviewed with patient all the symptoms that would need visit at our office and what would need to be seen at ED. Patient repeated back to me. She will call office if any questions.    Patient wanted to know when she was free to go in public. Advised will get recommendations from Dr. Milinda Antis. She would like [place] to send information in my chart message.

## 2022-09-26 ENCOUNTER — Ambulatory Visit
Admission: RE | Admit: 2022-09-26 | Discharge: 2022-09-26 | Disposition: A | Discharge status: Home / Self Care | Payer: BC Managed Care – PPO | Source: Ambulatory Visit | Attending: Family Medicine | Admitting: Family Medicine

## 2022-09-26 DIAGNOSIS — M25561 Pain in right knee: Secondary | ICD-10-CM | POA: Diagnosis not present

## 2022-09-26 DIAGNOSIS — R936 Abnormal findings on diagnostic imaging of limbs: Secondary | ICD-10-CM | POA: Diagnosis not present

## 2022-09-26 DIAGNOSIS — M2391 Unspecified internal derangement of right knee: Secondary | ICD-10-CM

## 2022-10-02 ENCOUNTER — Encounter: Payer: Self-pay | Admitting: *Deleted

## 2022-10-02 NOTE — Addendum Note (Signed)
Addended by: Hannah Beat on: 10/02/2022 11:35 AM   Modules accepted: Orders

## 2022-10-15 DIAGNOSIS — S8410XA Injury of peroneal nerve at lower leg level, unspecified leg, initial encounter: Secondary | ICD-10-CM | POA: Diagnosis not present

## 2022-10-27 DIAGNOSIS — D225 Melanocytic nevi of trunk: Secondary | ICD-10-CM | POA: Diagnosis not present

## 2022-10-27 DIAGNOSIS — L538 Other specified erythematous conditions: Secondary | ICD-10-CM | POA: Diagnosis not present

## 2022-10-27 DIAGNOSIS — L578 Other skin changes due to chronic exposure to nonionizing radiation: Secondary | ICD-10-CM | POA: Diagnosis not present

## 2022-10-27 DIAGNOSIS — R208 Other disturbances of skin sensation: Secondary | ICD-10-CM | POA: Diagnosis not present

## 2022-10-27 DIAGNOSIS — D2262 Melanocytic nevi of left upper limb, including shoulder: Secondary | ICD-10-CM | POA: Diagnosis not present

## 2022-10-27 DIAGNOSIS — D485 Neoplasm of uncertain behavior of skin: Secondary | ICD-10-CM | POA: Diagnosis not present

## 2022-10-27 DIAGNOSIS — D2261 Melanocytic nevi of right upper limb, including shoulder: Secondary | ICD-10-CM | POA: Diagnosis not present

## 2022-11-18 ENCOUNTER — Other Ambulatory Visit: Payer: Self-pay | Admitting: Family Medicine

## 2022-11-18 DIAGNOSIS — Z1231 Encounter for screening mammogram for malignant neoplasm of breast: Secondary | ICD-10-CM
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
## 2022-12-03 ENCOUNTER — Encounter: Payer: Self-pay | Admitting: Neurosurgery

## 2022-12-03 ENCOUNTER — Ambulatory Visit: Payer: BC Managed Care – PPO | Admitting: Neurosurgery

## 2022-12-03 VITALS — BP 132/86 | Ht 62.0 in | Wt 195.0 lb

## 2022-12-03 DIAGNOSIS — D3610 Benign neoplasm of peripheral nerves and autonomic nervous system, unspecified: Secondary | ICD-10-CM

## 2022-12-03 DIAGNOSIS — G5731 Lesion of lateral popliteal nerve, right lower limb: Secondary | ICD-10-CM | POA: Diagnosis not present

## 2022-12-04 ENCOUNTER — Telehealth: Payer: Self-pay | Admitting: *Deleted

## 2022-12-04 ENCOUNTER — Other Ambulatory Visit: Payer: Self-pay | Admitting: Family Medicine

## 2022-12-04 NOTE — Telephone Encounter (Signed)
-----   Message from [facility] sent at 12/03/2022  6:02 PM EDT ----- Please let pt know that her neurosurgeon reached out to [place] about medication to consider a change from gabapentin.  Lyrica is a similar medication (a bit more controlled status), unsure if this would work better or be less likely to cause brain fog.  Does she want to try that? ----- Message ----- From: Lovenia Kim, MD Sent: 12/03/2022   4:25 PM EDT To: Judy Pimple, MD

## 2022-12-04 NOTE — Telephone Encounter (Signed)
Spoke with patient and advised of notes. Patient is okay with trying the Lyrica. [facility]

## 2022-12-06 MED ORDER — PREGABALIN 75 MG PO CAPS: 75.0000 mg | ORAL_CAPSULE | Freq: Two times a day (BID) | ORAL | 0 refills | Status: DC

## 2022-12-06 NOTE — Telephone Encounter (Signed)
Stop gabapentin  Start lyrica 75 mg twice daily   Watch for sedation or dizziness or other side effects and let us know   Follow up in next 3-4 weeks to see how you are doing- then we can discuss titration of dose if needed I hope this helps   I will cc to neuro surg Dr Katrinka Blazing also

## 2022-12-07 NOTE — Telephone Encounter (Signed)
Patient called back in, I reviewed all information. Scheduled patient 4 wk f/u

## 2022-12-07 NOTE — Telephone Encounter (Signed)
Unable to reach patient. Left voicemail to return call to our office.   

## 2022-12-14 ENCOUNTER — Ambulatory Visit: Payer: BC Managed Care – PPO | Admitting: Neurosurgery

## 2022-12-18 ENCOUNTER — Ambulatory Visit
Admission: RE | Admit: 2022-12-18 | Discharge: 2022-12-18 | Disposition: A | Discharge status: Home / Self Care | Payer: BC Managed Care – PPO | Source: Ambulatory Visit | Attending: Family Medicine | Admitting: Family Medicine

## 2022-12-18 DIAGNOSIS — Z1231 Encounter for screening mammogram for malignant neoplasm of breast: Secondary | ICD-10-CM | POA: Insufficient documentation

## 2022-12-24 ENCOUNTER — Ambulatory Visit
Admission: RE | Admit: 2022-12-24 | Discharge: 2022-12-24 | Disposition: A | Discharge status: Home / Self Care | Payer: BC Managed Care – PPO | Source: Ambulatory Visit | Attending: Neurosurgery | Admitting: Neurosurgery

## 2022-12-24 DIAGNOSIS — G5731 Lesion of lateral popliteal nerve, right lower limb: Secondary | ICD-10-CM

## 2022-12-24 DIAGNOSIS — R2241 Localized swelling, mass and lump, right lower limb: Secondary | ICD-10-CM | POA: Diagnosis not present

## 2022-12-24 DIAGNOSIS — D3610 Benign neoplasm of peripheral nerves and autonomic nervous system, unspecified: Secondary | ICD-10-CM

## 2022-12-24 DIAGNOSIS — M1711 Unilateral primary osteoarthritis, right knee: Secondary | ICD-10-CM | POA: Diagnosis not present

## 2022-12-24 DIAGNOSIS — M7121 Synovial cyst of popliteal space [Baker], right knee: Secondary | ICD-10-CM | POA: Diagnosis not present

## 2022-12-24 DIAGNOSIS — R600 Localized edema: Secondary | ICD-10-CM | POA: Diagnosis not present

## 2022-12-24 MED ORDER — GADOPICLENOL 0.5 MMOL/ML IV SOLN
10.0000 mL | Freq: Once | INTRAVENOUS | Status: AC | PRN
  Administered 2022-12-24: 8 mL via INTRAVENOUS

## 2023-01-05 ENCOUNTER — Encounter: Payer: Self-pay | Admitting: Neurosurgery

## 2023-01-06 ENCOUNTER — Ambulatory Visit (INDEPENDENT_AMBULATORY_CARE_PROVIDER_SITE_OTHER)
Admission: RE | Admit: 2023-01-06 | Discharge: 2023-01-06 | Disposition: A | Discharge status: Home / Self Care | Payer: BC Managed Care – PPO | Source: Ambulatory Visit | Attending: Family Medicine | Admitting: Family Medicine

## 2023-01-06 ENCOUNTER — Encounter: Payer: Self-pay | Admitting: Family Medicine

## 2023-01-06 ENCOUNTER — Ambulatory Visit: Payer: BC Managed Care – PPO | Admitting: Family Medicine

## 2023-01-06 VITALS — BP 116/70 | HR 73 | Temp 99.0°F | Ht 62.0 in | Wt 204.2 lb

## 2023-01-06 DIAGNOSIS — M79641 Pain in right hand: Secondary | ICD-10-CM | POA: Insufficient documentation

## 2023-01-06 DIAGNOSIS — G5731 Lesion of lateral popliteal nerve, right lower limb: Secondary | ICD-10-CM | POA: Insufficient documentation

## 2023-01-06 MED ORDER — PREGABALIN 75 MG PO CAPS: 75.0000 mg | ORAL_CAPSULE | Freq: Two times a day (BID) | ORAL | 3 refills | Status: DC
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
## 2023-01-06 NOTE — Patient Instructions (Signed)
Xray of hand today  Use warm compress/warm water to loosen up when stiff  Alert if symptoms worsen  Continue lyrica at current dose  Follow up with neuro surgeon and MRI result eventually  Let us know if leg or knee symptoms hurt

## 2023-01-06 NOTE — Assessment & Plan Note (Signed)
Reviewed notes from neurosurgeon Dr Katrinka Blazing  Pending MRI to see if pt has cyst or tumor around right knee causing her sensory symptoms of leg and foot  Reassuring gait  Taking lyrica 75 mg bid- reports less side effects (brain fog) than the gabapentin so far  Tolerating well Is helping with discomfort  Will continue this  State database reviewed  Refilled for 30 days with 3 refills  Instructed to call if symptoms worsen or change
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
## 2023-01-07 NOTE — Addendum Note (Signed)
Addended by: Roxy Manns A on: 01/07/2023 04:10 PM   Modules accepted: Orders

## 2023-01-12 ENCOUNTER — Ambulatory Visit: Payer: BC Managed Care – PPO | Admitting: Orthopedic Surgery

## 2023-01-12 ENCOUNTER — Encounter: Payer: Self-pay | Admitting: Orthopedic Surgery

## 2023-01-12 DIAGNOSIS — S63634A Sprain of interphalangeal joint of right ring finger, initial encounter: Secondary | ICD-10-CM

## 2023-01-12 DIAGNOSIS — S63639A Sprain of interphalangeal joint of unspecified finger, initial encounter: Secondary | ICD-10-CM
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
## 2023-01-18 ENCOUNTER — Encounter: Payer: Self-pay | Admitting: Family Medicine

## 2023-01-18 ENCOUNTER — Ambulatory Visit (INDEPENDENT_AMBULATORY_CARE_PROVIDER_SITE_OTHER): Payer: BC Managed Care – PPO | Admitting: Neurosurgery

## 2023-01-18 DIAGNOSIS — G5731 Lesion of lateral popliteal nerve, right lower limb: Secondary | ICD-10-CM

## 2023-01-18 DIAGNOSIS — D3610 Benign neoplasm of peripheral nerves and autonomic nervous system, unspecified: Secondary | ICD-10-CM | POA: Insufficient documentation
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
## 2023-01-19 NOTE — Telephone Encounter (Signed)
Called patient states that she is feeling fine today. She states that she has not had any symptoms after Sunday.   Patient is most concerned of the pain she had on Sunday. Describes as  off and on, brief, intermittent, and 8 / 10 severity for pain in right arm on Sunday occuring  only the one time on Sunday  and lasting 1 minute per episode. Seen by cardiology but has been more than 15 yrs ago. Mom is on blood thinner but no other family cardiac issues that she is aware of. Patient has not checked her heart rate and denies any hypertension. Patient is not able to come in until Friday. I have scheduled appointment. Reviewed all red words with patient if any she will go to ED or call 911.

## 2023-01-22 ENCOUNTER — Ambulatory Visit: Payer: BC Managed Care – PPO | Admitting: Family Medicine

## 2023-01-22 ENCOUNTER — Encounter: Payer: Self-pay | Admitting: Family Medicine

## 2023-01-22 VITALS — BP 112/72 | HR 71 | Temp 98.3°F | Ht 62.0 in | Wt 201.0 lb

## 2023-01-22 DIAGNOSIS — E611 Iron deficiency: Secondary | ICD-10-CM | POA: Diagnosis not present

## 2023-01-22 DIAGNOSIS — R002 Palpitations: Secondary | ICD-10-CM | POA: Insufficient documentation

## 2023-01-22 DIAGNOSIS — Z13220 Encounter for screening for lipoid disorders: Secondary | ICD-10-CM | POA: Diagnosis not present

## 2023-01-22 DIAGNOSIS — M79601 Pain in right arm: Secondary | ICD-10-CM | POA: Diagnosis not present

## 2023-01-22 LAB — CBC WITH DIFFERENTIAL/PLATELET
Basophils Absolute: 0 10*3/uL (ref 0.0–0.1)
Basophils Relative: 0.5 % (ref 0.0–3.0)
Eosinophils Absolute: 0.2 10*3/uL (ref 0.0–0.7)
Eosinophils Relative: 2.5 % (ref 0.0–5.0)
HCT: 41.2 % (ref 36.0–46.0)
Hemoglobin: 13.8 g/dL (ref 12.0–15.0)
Lymphocytes Relative: 33.1 % (ref 12.0–46.0)
Lymphs Abs: 2.1 10*3/uL (ref 0.7–4.0)
MCHC: 33.5 g/dL (ref 30.0–36.0)
MCV: 91.9 fL (ref 78.0–100.0)
Monocytes Absolute: 0.6 10*3/uL (ref 0.1–1.0)
Monocytes Relative: 9 % (ref 3.0–12.0)
Neutro Abs: 3.6 10*3/uL (ref 1.4–7.7)
Neutrophils Relative %: 54.9 % (ref 43.0–77.0)
Platelets: 285 10*3/uL (ref 150.0–400.0)
RBC: 4.48 Mil/uL (ref 3.87–5.11)
RDW: 12.6 % (ref 11.5–15.5)
WBC: 6.5 10*3/uL (ref 4.0–10.5)

## 2023-01-22 LAB — COMPREHENSIVE METABOLIC PANEL
ALT: 16 U/L (ref 0–35)
AST: 14 U/L (ref 0–37)
Albumin: 4.2 g/dL (ref 3.5–5.2)
Alkaline Phosphatase: 64 U/L (ref 39–117)
BUN: 12 mg/dL (ref 6–23)
CO2: 25 meq/L (ref 19–32)
Calcium: 8.9 mg/dL (ref 8.4–10.5)
Chloride: 105 meq/L (ref 96–112)
Creatinine, Ser: 0.76 mg/dL (ref 0.40–1.20)
GFR: 94.17 mL/min (ref >60.00)
Glucose, Bld: 93 mg/dL (ref 70–99)
Potassium: 4.4 meq/L (ref 3.5–5.1)
Sodium: 137 meq/L (ref 135–145)
Total Bilirubin: 0.4 mg/dL (ref 0.2–1.2)
Total Protein: 6.6 g/dL (ref 6.0–8.3)

## 2023-01-22 LAB — LIPID PANEL
Cholesterol: 177 mg/dL (ref 0–200)
HDL: 59.5 mg/dL (ref >39.00)
LDL Cholesterol: 100 mg/dL — ABNORMAL HIGH (ref 0–99)
NonHDL: 117.04
Total CHOL/HDL Ratio: 3
Triglycerides: 86 mg/dL (ref 0.0–149.0)
VLDL: 17.2 mg/dL (ref 0.0–40.0)

## 2023-01-22 LAB — TSH: TSH: 0.77 u[IU]/mL (ref 0.35–5.50)

## 2023-01-22 LAB — IRON: Iron: 131 ug/dL (ref 42–145)

## 2023-01-22 NOTE — Assessment & Plan Note (Signed)
Intermittent and more notable at night in pt who drinks a lot of caffeine  No shortness of breath or other symptoms  Normal exam and EKG May be pvc/pac we are not picking up   Instructed to gradually cut caffeine until 1 or less serving per day (currently 6)  Labs today for anemia/thyroid /chem  Consider monitor /card ref if not improved  Call back and Er precautions noted in detail today    Of note saw cardiology years ago -unable to retrieve records ? History of mvp but no M heard today

## 2023-01-22 NOTE — Assessment & Plan Note (Signed)
From menses Past blood donation  Takes iron  Lab today   Some fatigue and palpitations

## 2023-01-22 NOTE — Patient Instructions (Addendum)
Start gradually dialing back caffeine by one cup for week  Decaf is an ok sub  Get down to one cup per day   Labs today   Plan to follow that   If you have an episode of palpitations - worse/ longer/ with pain or shortness of breath - go to the ER   Keep [place] posted
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
## 2023-01-22 NOTE — Assessment & Plan Note (Signed)
Fleeting episode / sharp and while driving Atypical for cardiac issue  Normal exam Instructed to call if this re occurs

## 2023-02-03 DIAGNOSIS — G5731 Lesion of lateral popliteal nerve, right lower limb: Secondary | ICD-10-CM

## 2023-02-03 HISTORY — DX: Lesion of lateral popliteal nerve, right lower limb: G57.31

## 2023-03-11 ENCOUNTER — Encounter: Payer: Self-pay | Admitting: Neurosurgery

## 2023-03-16 DIAGNOSIS — R509 Fever, unspecified: Secondary | ICD-10-CM | POA: Diagnosis not present

## 2023-03-16 DIAGNOSIS — J111 Influenza due to unidentified influenza virus with other respiratory manifestations: Secondary | ICD-10-CM | POA: Diagnosis not present

## 2023-03-16 DIAGNOSIS — Z20822 Contact with and (suspected) exposure to covid-19: Secondary | ICD-10-CM | POA: Diagnosis not present

## 2023-03-17 ENCOUNTER — Ambulatory Visit: Payer: BC Managed Care – PPO | Admitting: Neurosurgery

## 2023-03-24 ENCOUNTER — Ambulatory Visit: Payer: BC Managed Care – PPO | Admitting: Neurosurgery

## 2023-03-26 ENCOUNTER — Ambulatory Visit: Payer: BC Managed Care – PPO | Admitting: Neurosurgery

## 2023-03-26 VITALS — BP 124/74 | Ht 62.0 in | Wt 207.0 lb

## 2023-03-26 DIAGNOSIS — G5731 Lesion of lateral popliteal nerve, right lower limb: Secondary | ICD-10-CM | POA: Diagnosis not present

## 2023-03-26 DIAGNOSIS — D3610 Benign neoplasm of peripheral nerves and autonomic nervous system, unspecified: Secondary | ICD-10-CM

## 2023-03-26 NOTE — Patient Instructions (Signed)
 Please see below for information in regards to your upcoming surgery:   Planned surgery: Right Peroneal Nerve Mass resection    Surgery date:  06/15/2023 at [facility] (Medical Mall: [address], [place], [place] [address]) - you will find out your arrival time the business day before your surgery.   Pre-op appointment at [facility] Pre-admit Testing: we will call you with a date/time for this. If you are scheduled for an in person appointment, Pre-admit Testing is located on the first floor of the [facility] building, [address], [address]. Please bring all prescriptions in the original prescription bottles to your appointment. During this appointment, they will advise you which medications you can take the morning of surgery, and which medications you will need to hold for surgery. Labs (such as blood work, EKG) may be done at your pre-op appointment. You are not required to fast for these labs. Should you need to change your pre-op appointment, please call Pre-admit testing at [phone].   Surgical clearance: we may send a clearance form to your PCP . They may wish to see you in their office prior to signing the clearance form. If so, they may call you to schedule an appointment.  How to contact us:  If you have any questions/concerns before or after surgery, you can reach [place] at [phone], or you can send a mychart message. We can be reached by phone or mychart 8am-4pm, Monday-Friday.  *Please note: Calls after 4pm are forwarded to a third party answering service. Mychart messages are not routinely monitored during evenings, weekends, and holidays. Please call our office to contact the answering service for urgent concerns during non-business hours.   Appointments/FMLA & disability paperwork: Joycelyn Rua, & Flonnie Hailstone Registered Nurse/Surgery scheduler: Royston Cowper Medical Assistants: Nash Mantis Physician Assistants: Joan Flores, PA-C,  Manning Charity, PA-C & Drake Leach, PA-C Surgeons: Venetia Night, MD & Ernestine Mcmurray, MD
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
## 2023-04-08 DIAGNOSIS — J301 Allergic rhinitis due to pollen: Secondary | ICD-10-CM | POA: Diagnosis not present

## 2023-04-08 DIAGNOSIS — J3089 Other allergic rhinitis: Secondary | ICD-10-CM | POA: Diagnosis not present

## 2023-04-08 DIAGNOSIS — J4530 Mild persistent asthma, uncomplicated: Secondary | ICD-10-CM | POA: Diagnosis not present

## 2023-04-08 DIAGNOSIS — H1045 Other chronic allergic conjunctivitis: Secondary | ICD-10-CM | POA: Diagnosis not present

## 2023-04-27 ENCOUNTER — Other Ambulatory Visit: Payer: Self-pay

## 2023-04-27 DIAGNOSIS — Z01818 Encounter for other preprocedural examination: Secondary | ICD-10-CM

## 2023-06-02 ENCOUNTER — Encounter
Admission: RE | Admit: 2023-06-02 | Discharge: 2023-06-02 | Disposition: A | Discharge status: Home / Self Care | Source: Ambulatory Visit | Attending: Neurosurgery | Admitting: Neurosurgery

## 2023-06-02 ENCOUNTER — Other Ambulatory Visit: Payer: Self-pay

## 2023-06-02 VITALS — BP 122/85 | HR 66 | Resp 16 | Ht 62.0 in | Wt 198.0 lb

## 2023-06-02 DIAGNOSIS — Z01812 Encounter for preprocedural laboratory examination: Secondary | ICD-10-CM | POA: Insufficient documentation

## 2023-06-02 DIAGNOSIS — D649 Anemia, unspecified: Secondary | ICD-10-CM | POA: Diagnosis not present

## 2023-06-02 HISTORY — DX: Anemia, unspecified: D64.9

## 2023-06-02 HISTORY — DX: Anxiety disorder, unspecified: F41.9

## 2023-06-02 HISTORY — DX: Cardiac murmur, unspecified: R01.1

## 2023-06-02 LAB — CBC
HCT: 39.5 % (ref 36.0–46.0)
Hemoglobin: 13.5 g/dL (ref 12.0–15.0)
MCH: 30.9 pg (ref 26.0–34.0)
MCHC: 34.2 g/dL (ref 30.0–36.0)
MCV: 90.4 fL (ref 80.0–100.0)
Platelets: 248 10*3/uL (ref 150–400)
RBC: 4.37 MIL/uL (ref 3.87–5.11)
RDW: 11.9 % (ref 11.5–15.5)
WBC: 4 10*3/uL (ref 4.0–10.5)
nRBC: 0 % (ref 0.0–0.2)

## 2023-06-02 NOTE — Patient Instructions (Addendum)
 Your procedure is scheduled on: 06/15/23 - Tuesday Report to the Registration Desk on the 1st floor of the [facility]. To find out your arrival time, please call [phone] between 1PM - 3PM on: 06/14/23 - Monday If your arrival time is 6:00 am, do not arrive before that time as the [facility] entrance doors do not open until 6:00 am.  REMEMBER: Instructions that are not followed completely may result in serious medical risk, up to and including death; or upon the discretion of your surgeon and anesthesiologist your surgery may need to be rescheduled.  Do not eat food after midnight the night before surgery.  No gum chewing or hard candies.  You may however, drink CLEAR liquids up to 2 hours before you are scheduled to arrive for your surgery. Do not drink anything within 2 hours of your scheduled arrival time.  Clear liquids include: - water  - apple juice without pulp - gatorade (not RED colors) - black coffee or tea (Do NOT add milk or creamers to the coffee or tea) Do NOT drink anything that is not on this list.  One week prior to surgery beginning 05/06 : Stop Anti-inflammatories (NSAIDS) such as Advil , Aleve, Ibuprofen , Motrin , Naproxen, Naprosyn and Aspirin based products such as Excedrin, Goody's Powder, BC Powder. You may take Tylenol  if needed for pain up until the day of surgery.  Stop Beginning 05/06, ANY OVER THE COUNTER supplements until after surgery : Hold Vit B3  You may take Tylenol  if needed for pain up until the day of surgery.   ON THE DAY OF SURGERY ONLY TAKE THESE MEDICATIONS WITH SIPS OF WATER: NONE   No Alcohol for 24 hours before or after surgery.  No Smoking including e-cigarettes for 24 hours before surgery.  No chewable tobacco products for at least 6 hours before surgery.  No nicotine patches on the day of surgery.  Do not use any "recreational" drugs for at least a week (preferably 2 weeks) before your surgery.  Please be advised that the  combination of cocaine and anesthesia may have negative outcomes, up to and including death. If you test positive for cocaine, your surgery will be cancelled.  On the morning of surgery brush your teeth with toothpaste and water, you may rinse your mouth with mouthwash if you wish. Do not swallow any toothpaste or mouthwash.  Use CHG Soap or wipes as directed on instruction sheet.  Do not wear jewelry, make-up, hairpins, clips or nail polish.  For welded (permanent) jewelry: bracelets, anklets, waist bands, etc.  Please have this removed prior to surgery.  If it is not removed, there is a chance that hospital personnel will need to cut it off on the day of surgery.  Do not wear lotions, powders, or perfumes.   Do not shave body hair from the neck down 48 hours before surgery.  Contact lenses, hearing aids and dentures may not be worn into surgery.  Do not bring valuables to the hospital. [facility] is not responsible for any missing/lost belongings or valuables.   Notify your doctor if there is any change in your medical condition (cold, fever, infection).  Wear comfortable clothing (specific to your surgery type) to the hospital.  After surgery, you can help prevent lung complications by doing breathing exercises.  Take deep breaths and cough every 1-2 hours. Your doctor may order a device called an Incentive Spirometer to help you take deep breaths.  When coughing or sneezing, hold a pillow  firmly against your incision with both hands. This is called "splinting." Doing this helps protect your incision. It also decreases belly discomfort.  If you are being admitted to the hospital overnight, leave your suitcase in the car. After surgery it may be brought to your room.  In case of increased patient census, it may be necessary for you, the patient, to continue your postoperative care in the Same Day Surgery department.  If you are being discharged the day of surgery, you will not be  allowed to drive home. You will need a responsible individual to drive you home and stay with you for 24 hours after surgery.   If you are taking public transportation, you will need to have a responsible individual with you.  Please call the Pre-admissions Testing Dept. at [phone] if you have any questions about these instructions.  Surgery Visitation Policy:  Patients having surgery or a procedure may have two visitors.  Children under the age of 75 must have an adult with them who is not the patient.  Inpatient Visitation:    Visiting hours are 7 a.m. to 8 p.m. Up to four visitors are allowed at one time in a patient room. The visitors may rotate out with other people during the day.  One visitor age 67 or older may stay with the patient overnight and must be in the room by 8 p.m.    Preparing for Surgery with CHLORHEXIDINE GLUCONATE (CHG) Soap  Chlorhexidine Gluconate (CHG) Soap  o An antiseptic cleaner that kills germs and bonds with the skin to continue killing germs even after washing  o Used for showering the night before surgery and morning of surgery  Before surgery, you can play an important role by reducing the number of germs on your skin.  CHG (Chlorhexidine gluconate) soap is an antiseptic cleanser which kills germs and bonds with the skin to continue killing germs even after washing.  Please do not use if you have an allergy to CHG or antibacterial soaps. If your skin becomes reddened/irritated stop using the CHG.  1. Shower the NIGHT BEFORE SURGERY and the MORNING OF SURGERY with CHG soap.  2. If you choose to wash your hair, wash your hair first as usual with your normal shampoo.  3. After shampooing, rinse your hair and body thoroughly to remove the shampoo.  4. Use CHG as you would any other liquid soap. You can apply CHG directly to the skin and wash gently with a scrungie or a clean washcloth.  5. Apply the CHG soap to your body only from the neck  down. Do not use on open wounds or open sores. Avoid contact with your eyes, ears, mouth, and genitals (private parts). Wash face and genitals (private parts) with your normal soap.  6. Wash thoroughly, paying special attention to the area where your surgery will be performed.  7. Thoroughly rinse your body with warm water.  8. Do not shower/wash with your normal soap after using and rinsing off the CHG soap.  9. Pat yourself dry with a clean towel.  10. Wear clean pajamas to bed the night before surgery.  12. Place clean sheets on your bed the night of your first shower and do not sleep with pets.  13. Shower again with the CHG soap on the day of surgery prior to arriving at the hospital.  14. Do not apply any deodorants/lotions/powders.  15. Please wear clean clothes to the hospital.

## 2023-06-15 ENCOUNTER — Encounter
Admission: RE | Disposition: A | Discharge status: Home / Self Care | Payer: Self-pay | Source: Ambulatory Visit | Attending: Neurosurgery

## 2023-06-15 ENCOUNTER — Other Ambulatory Visit: Payer: Self-pay

## 2023-06-15 ENCOUNTER — Ambulatory Visit
Admission: RE | Admit: 2023-06-15 | Discharge: 2023-06-15 | Disposition: A | Discharge status: Home / Self Care | Source: Ambulatory Visit | Attending: Neurosurgery | Admitting: Neurosurgery

## 2023-06-15 ENCOUNTER — Ambulatory Visit: Payer: Self-pay | Admitting: Urgent Care

## 2023-06-15 ENCOUNTER — Ambulatory Visit: Admitting: Certified Registered"

## 2023-06-15 ENCOUNTER — Encounter: Payer: Self-pay | Admitting: Neurosurgery

## 2023-06-15 DIAGNOSIS — Z01818 Encounter for other preprocedural examination: Secondary | ICD-10-CM

## 2023-06-15 DIAGNOSIS — D649 Anemia, unspecified: Secondary | ICD-10-CM | POA: Diagnosis not present

## 2023-06-15 DIAGNOSIS — Z87891 Personal history of nicotine dependence: Secondary | ICD-10-CM | POA: Diagnosis not present

## 2023-06-15 DIAGNOSIS — J45909 Unspecified asthma, uncomplicated: Secondary | ICD-10-CM | POA: Insufficient documentation

## 2023-06-15 DIAGNOSIS — Z01812 Encounter for preprocedural laboratory examination: Secondary | ICD-10-CM

## 2023-06-15 DIAGNOSIS — F419 Anxiety disorder, unspecified: Secondary | ICD-10-CM | POA: Insufficient documentation

## 2023-06-15 DIAGNOSIS — E669 Obesity, unspecified: Secondary | ICD-10-CM | POA: Insufficient documentation

## 2023-06-15 DIAGNOSIS — D3610 Benign neoplasm of peripheral nerves and autonomic nervous system, unspecified: Secondary | ICD-10-CM | POA: Diagnosis present

## 2023-06-15 DIAGNOSIS — Z6834 Body mass index (BMI) 34.0-34.9, adult: Secondary | ICD-10-CM | POA: Diagnosis not present

## 2023-06-15 DIAGNOSIS — D3613 Benign neoplasm of peripheral nerves and autonomic nervous system of lower limb, including hip: Secondary | ICD-10-CM | POA: Insufficient documentation

## 2023-06-15 DIAGNOSIS — G5731 Lesion of lateral popliteal nerve, right lower limb: Secondary | ICD-10-CM | POA: Diagnosis not present

## 2023-06-15 DIAGNOSIS — Z79899 Other long term (current) drug therapy: Secondary | ICD-10-CM | POA: Diagnosis not present

## 2023-06-15 HISTORY — PX: EXCISION OF NERVE MASS, CUTANEOUS NERVE, PERONEAL: SHX7424

## 2023-06-15 LAB — POCT PREGNANCY, URINE: Preg Test, Ur: NEGATIVE

## 2023-06-15 SURGERY — EXCISION OF NERVE MASS, CUTANEOUS NERVE, PERONEAL
Anesthesia: General | Site: Leg Lower | Laterality: Right

## 2023-06-15 MED ORDER — FENTANYL CITRATE (PF) 100 MCG/2ML IJ SOLN
INTRAMUSCULAR | Status: DC | PRN
Start: 2023-06-15 — End: 2023-06-15
  Administered 2023-06-15 (×4): 25 ug via INTRAVENOUS

## 2023-06-15 MED ORDER — 0.9 % SODIUM CHLORIDE (POUR BTL) OPTIME
TOPICAL | Status: DC | PRN
  Administered 2023-06-15: 500 mL

## 2023-06-15 MED ORDER — OXYCODONE HCL 5 MG/5ML PO SOLN: 5.0000 mg | Freq: Once | ORAL | Status: DC | PRN

## 2023-06-15 MED ORDER — LIDOCAINE HCL (CARDIAC) PF 100 MG/5ML IV SOSY
PREFILLED_SYRINGE | INTRAVENOUS | Status: DC | PRN
  Administered 2023-06-15: 80 mg via INTRAVENOUS

## 2023-06-15 MED ORDER — LACTATED RINGERS IV SOLN: INTRAVENOUS | Status: DC

## 2023-06-15 MED ORDER — OXYCODONE HCL 5 MG PO TABS: 5.0000 mg | ORAL_TABLET | ORAL | 0 refills | Status: DC | PRN

## 2023-06-15 MED ORDER — CEFAZOLIN SODIUM-DEXTROSE 2-4 GM/100ML-% IV SOLN
2.0000 g | Freq: Once | INTRAVENOUS | Status: AC
  Administered 2023-06-15: 2 g via INTRAVENOUS

## 2023-06-15 MED ORDER — PROPOFOL 1000 MG/100ML IV EMUL
INTRAVENOUS | Status: AC
  Filled 2023-06-15: qty 100

## 2023-06-15 MED ORDER — CHLORHEXIDINE GLUCONATE 0.12 % MT SOLN
OROMUCOSAL | Status: AC
  Filled 2023-06-15: qty 15

## 2023-06-15 MED ORDER — OXYCODONE HCL 5 MG PO TABS: 5.0000 mg | ORAL_TABLET | Freq: Once | ORAL | Status: DC | PRN

## 2023-06-15 MED ORDER — FENTANYL CITRATE (PF) 100 MCG/2ML IJ SOLN: 25.0000 ug | INTRAMUSCULAR | Status: DC | PRN

## 2023-06-15 MED ORDER — DEXAMETHASONE SODIUM PHOSPHATE 10 MG/ML IJ SOLN
INTRAMUSCULAR | Status: DC | PRN
  Administered 2023-06-15: 10 mg via INTRAVENOUS

## 2023-06-15 MED ORDER — ACETAMINOPHEN 500 MG PO TABS: 500.0000 mg | ORAL_TABLET | Freq: Four times a day (QID) | ORAL | 0 refills | Status: DC | PRN

## 2023-06-15 MED ORDER — PROPOFOL 10 MG/ML IV BOLUS
INTRAVENOUS | Status: DC | PRN
  Administered 2023-06-15: 200 mg via INTRAVENOUS

## 2023-06-15 MED ORDER — MIDAZOLAM HCL 2 MG/2ML IJ SOLN
INTRAMUSCULAR | Status: AC
  Filled 2023-06-15: qty 2

## 2023-06-15 MED ORDER — DOCUSATE SODIUM 100 MG PO CAPS: 100.0000 mg | ORAL_CAPSULE | Freq: Two times a day (BID) | ORAL | 0 refills | Status: DC

## 2023-06-15 MED ORDER — SENNA 8.6 MG PO TABS
1.0000 | ORAL_TABLET | Freq: Every day | ORAL | 0 refills | Status: DC
Start: 2023-06-15 — End: 2023-06-30

## 2023-06-15 MED ORDER — BUPIVACAINE-EPINEPHRINE 0.5% -1:200000 IJ SOLN
INTRAMUSCULAR | Status: DC | PRN
  Administered 2023-06-15: 7 mL

## 2023-06-15 MED ORDER — EPHEDRINE SULFATE-NACL 50-0.9 MG/10ML-% IV SOSY
PREFILLED_SYRINGE | INTRAVENOUS | Status: DC | PRN
  Administered 2023-06-15 (×3): 5 mg via INTRAVENOUS

## 2023-06-15 MED ORDER — ORAL CARE MOUTH RINSE: 15.0000 mL | Freq: Once | OROMUCOSAL | Status: AC

## 2023-06-15 MED ORDER — FENTANYL CITRATE (PF) 100 MCG/2ML IJ SOLN
INTRAMUSCULAR | Status: AC
  Filled 2023-06-15: qty 2

## 2023-06-15 MED ORDER — METHYLPREDNISOLONE ACETATE 40 MG/ML IJ SUSP
INTRAMUSCULAR | Status: AC
  Filled 2023-06-15: qty 2

## 2023-06-15 MED ORDER — ONDANSETRON HCL 4 MG/2ML IJ SOLN
INTRAMUSCULAR | Status: DC | PRN
  Administered 2023-06-15: 4 mg via INTRAVENOUS

## 2023-06-15 MED ORDER — ONDANSETRON HCL 4 MG/2ML IJ SOLN: 4.0000 mg | Freq: Once | INTRAMUSCULAR | Status: DC | PRN

## 2023-06-15 MED ORDER — PROPOFOL 10 MG/ML IV BOLUS
INTRAVENOUS | Status: AC
  Filled 2023-06-15: qty 20

## 2023-06-15 MED ORDER — MIDAZOLAM HCL 2 MG/2ML IJ SOLN
INTRAMUSCULAR | Status: DC | PRN
  Administered 2023-06-15: 2 mg via INTRAVENOUS

## 2023-06-15 MED ORDER — CEFAZOLIN SODIUM-DEXTROSE 2-4 GM/100ML-% IV SOLN
INTRAVENOUS | Status: AC
  Filled 2023-06-15: qty 100

## 2023-06-15 MED ORDER — DEXMEDETOMIDINE HCL IN NACL 80 MCG/20ML IV SOLN
INTRAVENOUS | Status: DC | PRN
  Administered 2023-06-15: 4 ug via INTRAVENOUS
  Administered 2023-06-15: 8 ug via INTRAVENOUS

## 2023-06-15 MED ORDER — CHLORHEXIDINE GLUCONATE 0.12 % MT SOLN
15.0000 mL | Freq: Once | OROMUCOSAL | Status: AC
  Administered 2023-06-15: 15 mL via OROMUCOSAL

## 2023-06-15 MED ORDER — BUPIVACAINE-EPINEPHRINE (PF) 0.5% -1:200000 IJ SOLN
INTRAMUSCULAR | Status: AC
  Filled 2023-06-15: qty 10

## 2023-06-15 MED ORDER — ACETAMINOPHEN 10 MG/ML IV SOLN: 1000.0000 mg | Freq: Once | INTRAVENOUS | Status: DC | PRN

## 2023-06-15 MED ORDER — LIDOCAINE HCL (PF) 2 % IJ SOLN
INTRAMUSCULAR | Status: AC
  Filled 2023-06-15: qty 5

## 2023-06-15 SURGICAL SUPPLY — 40 items
BRUSH SCRUB EZ 4% CHG (MISCELLANEOUS) ×1 IMPLANT
CHLORAPREP W/TINT 26 (MISCELLANEOUS) ×1 IMPLANT
CLAMP MUSCLE BIOPSY 12MM DISP (MISCELLANEOUS) IMPLANT
CORD BIP STRL DISP 12FT (MISCELLANEOUS) ×1 IMPLANT
COVER PROBE FLX POLY STRL (MISCELLANEOUS) ×1 IMPLANT
DERMABOND ADVANCED .7 DNX12 (GAUZE/BANDAGES/DRESSINGS) ×1 IMPLANT
DRAPE INCISE IOBAN 66X45 STRL (DRAPES) ×1 IMPLANT
DRAPE LAPAROTOMY 77X122 PED (DRAPES) ×1 IMPLANT
DRAPE MICROSCOPE SPINE 48X150 (DRAPES) IMPLANT
DRAPE SHEET LG 3/4 BI-LAMINATE (DRAPES) ×1 IMPLANT
DRAPE SURG 17X11 SM STRL (DRAPES) ×2 IMPLANT
DRSG OPSITE POSTOP 3X4 (GAUZE/BANDAGES/DRESSINGS) ×1 IMPLANT
DRSG OPSITE POSTOP 4X6 (GAUZE/BANDAGES/DRESSINGS) IMPLANT
FEE INTRAOP CADWELL SUPPLY NCS (MISCELLANEOUS) IMPLANT
FEE INTRAOP MONITOR IMPULS NCS (MISCELLANEOUS) IMPLANT
FORCEPS JEWEL BIP 4-3/4 STR (INSTRUMENTS) ×1 IMPLANT
GAUZE SPONGE 4X4 12PLY STRL (GAUZE/BANDAGES/DRESSINGS) ×1 IMPLANT
GAUZE XEROFORM 1X8 LF (GAUZE/BANDAGES/DRESSINGS) ×1 IMPLANT
GLOVE BIOGEL PI IND STRL 8 (GLOVE) ×2 IMPLANT
GLOVE SURG SYN 7.5 E (GLOVE) ×1 IMPLANT
GLOVE SURG SYN 7.5 PF PI (GLOVE) ×1 IMPLANT
GOWN SRG XL LVL 3 NONREINFORCE (GOWNS) ×1 IMPLANT
GOWN STRL REUS W/ TWL LRG LVL3 (GOWN DISPOSABLE) ×1 IMPLANT
KIT TURNOVER KIT A (KITS) ×1 IMPLANT
LOOP VESSEL MINI 0.8X406 BLUE (MISCELLANEOUS) IMPLANT
MANIFOLD NEPTUNE II (INSTRUMENTS) ×1 IMPLANT
NDL HYPO 25X1 1.5 SAFETY (NEEDLE) ×1 IMPLANT
NEEDLE HYPO 25X1 1.5 SAFETY (NEEDLE) ×1 IMPLANT
NS IRRIG 500ML POUR BTL (IV SOLUTION) ×1 IMPLANT
PACK BASIN MINOR ARMC (MISCELLANEOUS) ×1 IMPLANT
PROBE MONO 100X0.75X1.9 NCS (MISCELLANEOUS) IMPLANT
SPONGE KITTNER 5P (MISCELLANEOUS) ×1 IMPLANT
STAPLER SKIN PROX 35W (STAPLE) ×1 IMPLANT
SUT ETHILON 8 0 (SUTURE) IMPLANT
SUT STRATA 3-0 15 PS-2 (SUTURE) ×1 IMPLANT
SUT VIC AB 2-0 CT1 18 (SUTURE) ×1 IMPLANT
SUT VIC AB 2-0 SH 27XBRD (SUTURE) ×1 IMPLANT
SUT VIC AB 3-0 SH 27X BRD (SUTURE) ×1 IMPLANT
TRAP FLUID SMOKE EVACUATOR (MISCELLANEOUS) ×1 IMPLANT
WATER STERILE IRR 500ML POUR (IV SOLUTION) ×1 IMPLANT

## 2023-06-15 NOTE — Anesthesia Preprocedure Evaluation (Addendum)
 Anesthesia Evaluation  Patient identified by MRN, date of birth, ID band Patient awake    Reviewed: Allergy & Precautions, NPO status , Patient's Chart, lab work & pertinent test results  History of Anesthesia Complications Negative for: history of anesthetic complications  Airway Mallampati: I   Neck ROM: Full    Dental no notable dental hx.    Pulmonary asthma , former smoker (quit 2008)   Pulmonary exam normal breath sounds clear to auscultation       Cardiovascular Exercise Tolerance: Good Normal cardiovascular exam+ Valvular Problems/Murmurs MVP  Rhythm:Regular Rate:Normal  ECG 01/22/23:  normal   Neuro/Psych  PSYCHIATRIC DISORDERS Anxiety     negative neurological ROS     GI/Hepatic negative GI ROS,,,  Endo/Other  Obesity   Renal/GU negative Renal ROS     Musculoskeletal   Abdominal   Peds  Hematology  (+) Blood dyscrasia, anemia   Anesthesia Other Findings   Reproductive/Obstetrics                             Anesthesia Physical Anesthesia Plan  ASA: 2  Anesthesia Plan: General   Post-op Pain Management:    Induction: Intravenous  PONV Risk Score and Plan: 3 and Ondansetron , Dexamethasone and Treatment may vary due to age or medical condition  Airway Management Planned: LMA  Additional Equipment:   Intra-op Plan:   Post-operative Plan: Extubation in OR  Informed Consent: I have reviewed the patients History and Physical, chart, labs and discussed the procedure including the risks, benefits and alternatives for the proposed anesthesia with the patient or authorized representative who has indicated his/her understanding and acceptance.     Dental advisory given  Plan Discussed with: CRNA  Anesthesia Plan Comments: (Patient consented for risks of anesthesia including but not limited to:  - adverse reactions to medications - damage to eyes, teeth, lips or other  oral mucosa - nerve damage due to positioning  - sore throat or hoarseness - damage to heart, brain, nerves, lungs, other parts of body or loss of life  Informed patient about role of CRNA in peri- and intra-operative care.  Patient voiced understanding.)        Anesthesia Quick Evaluation

## 2023-06-15 NOTE — Op Note (Signed)
 Indications: Patient was found to have a peroneal neuropathy on clinical exam and with electrodiagnostic and ultrasound testing.  Given their persistent symptoms in the face of conservative management, they were taken to the OR for peroneal nerve decompression.  Findings: Severe compression and flattening of the peroneal nerve at the posterior crural ligament   Preoperative Diagnosis: Peroneal nerve sheath tumor Postoperative diagnosis: Same    EBL: Minimal IVF: See anesthesia report Drains: none Disposition:Stable to PACU Complications: none   No foley catheter was placed.     Preoperative Note: The patient was seen again in the preoperative area.  They continue to have symptoms of peroneal neuropathy that were causing a significant impact on their lives.  They did not have any improvement with conservative management therefore we planned for a peroneal nerve decompression.  Risk of surgery is discussed and include: Infection, bleeding, wound healing issues, nerve injury, pain, failure to relieve the symptoms, need for further surgery.   Procedure:  1) right sided peroneal nerve sheath tumor resection   Procedure: After obtaining informed consent, the patient taken to the operating room, placed in supine position, monitored anesthesia care was induced.  They were given preoperative antibiotics.  Prepped and draped in the usual fashion.  Comprehensive timeout was performed verifying the patient's name, MRN, planned procedure.   Patient was induced and monitored anesthesia care.  They were supine on the bed.  Her hip was padded. Down leg was point padded.  They were then prepped and draped in the usual fashion.  The nerve was clearly palpable inferior to the head of the fibula preoperative ultrasound was utilized to evaluate the peroneal nerve as it crossed over the fibular neck and under the lateral compartment musculature.  The skin was infiltrated with local anesthetic.  The skin was  opened sharply.  This brought us  down to the investing fascia.  Investing fascia was opened sharply with care taken to spare any crossing nerves or vessels.  We then followed the investing fascia deep to the lateral compartment.  We were able to identify the fascia over top of the lateral compartment.  This was opened sharply.  At this point we were able to identify the nerve sheath tumor, we isolated both proximally and distally.  Under microscopic guidance we are able to see crossing fascicles, we meticulously worked to dissect the fascicles off of the nerve sheath tumor in a subcapsular fashion.  We utilized intraoperative monitoring throughout the whole procedure to help with our resection.  At the end of their dissection we were able to see a single entering and exiting fascicle which were stimulated without any motor impact.  We then resected the tumor and send off her final pathology.  We decompressed the rest of the peroneal nerve within the operative field opening fascia both proximally and distally make sure that it was well decompressed.  We irrigated copiously.  We then infiltrated it with Depo-Medrol.  We stimulated proximal to the tumor resection and were able to get motor response in all major muscle groups.  At this point we began our multilayer closure with glue in the superficial layer.   No immediate complications.   Sponge and pattie counts were correct at the end of the procedure.    I performed this procedure with without AN assistant surgeon   Carroll Clamp, MD/MSCR

## 2023-06-15 NOTE — Anesthesia Procedure Notes (Signed)
 Procedure Name: LMA Insertion Date/Time: 06/15/2023 7:22 AM  Performed by: Noelia Batman, CRNAPre-anesthesia Checklist: Patient identified, Emergency Drugs available, Suction available and Patient being monitored Patient Re-evaluated:Patient Re-evaluated prior to induction Oxygen Delivery Method: Circle System Utilized Preoxygenation: Pre-oxygenation with 100% oxygen Induction Type: IV induction Ventilation: Mask ventilation without difficulty LMA: LMA inserted LMA Size: 4.0 Number of attempts: 1 Airway Equipment and Method: Bite block Placement Confirmation: positive ETCO2 Tube secured with: Tape Dental Injury: Teeth and Oropharynx as per pre-operative assessment

## 2023-06-15 NOTE — Interval H&P Note (Signed)
 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  The various methods of treatment have been discussed with the patient and family. After consideration of risks, benefits and other options for treatment, the patient has consented to  Procedure(s) with comments: EXCISION OF NERVE MASS, CUTANEOUS NERVE, PERONEAL (Right) - RIGHT PERONEAL NERVE MASS RESECTION as a surgical intervention.  The patient's history has been reviewed, patient examined, no change in status, stable for surgery.  I have reviewed the patient's chart and labs.  Questions were answered to the patient's satisfaction.     Carroll Clamp
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
## 2023-06-15 NOTE — Discharge Instructions (Addendum)
    Where possible, avoid household activities that involve lifting, bending, reaching, pushing, or pulling such as laundry, vacuuming, grocery shopping, and childcare. Try to arrange for help from friends and family for these activities while you heal. You may walk as tolerated.  You should not drive until you are no longer taking narcotic pain medication and you feel safe to operate a moving vehicle.     You should rest at home and let your body heal.   You may remove the top bandage tomorrow. 3 days post-op you may remove the rest of your bandage.  You may shower three days after your surgery.  After showering, lightly dab your incision dry. Do not take a tub bath or go swimming until approved by your doctor at your follow-up appointment.  If you smoke, we strongly recommend that you quit.  Smoking has been proven to interfere with wound healing will dramatically reduce the success rate of your surgery. Please contact [facility] (800-QUIT-NOW) and use the resources at www.[facility].com for assistance in stopping smoking.  Surgical Incision    The steri-strips/glue should begin to peel away within about a week. Diet           You may return to your usual diet. Be sure to stay hydrated.  You have been prescribed narcotic pain medications.  This often will cause constipation along with the anesthesia that you underwent.  Please obtain Colace and senna. This has been sent to your local pharmacy, but at times it is not covered by insurance and you may obtain it over the counter..  You should take a stool softener and laxative for the duration of you taking the narcotic pain medications.  When to Contact Us   Contact us  immediately if you have any: New numbness or weakness Pain that is progressively getting worse, and is not relieved by your pain medication, muscle relaxers, rest, and warm compresses Bleeding, redness, swelling, pain, or drainage from surgical incision Chills or flu-like  symptoms Fever greater than 101.0 F (38.3 C) Inability to eat, drink fluids, or take medications Problems with bowel or bladder functions Difficulty breathing or shortness of breath Warmth, tenderness, or swelling in your calf Contact Information How to contact us :  If you have any questions/concerns before or after surgery, you can reach us  at [phone], or you can send a mychart message. We can be reached by phone or mychart 8am-4pm, Monday-Friday.  *Please note: Calls after 4pm are forwarded to a third party answering service. Mychart messages are not routinely monitored during evenings, weekends, and holidays. Please call our office to contact the answering service for urgent concerns during non-business hours.

## 2023-06-15 NOTE — Anesthesia Postprocedure Evaluation (Signed)
 Anesthesia Post Note  Patient: April Daniels  Procedure(s) Performed: EXCISION OF NERVE MASS, CUTANEOUS NERVE, PERONEAL (Right: Leg Lower)  Patient location during evaluation: PACU Anesthesia Type: General Level of consciousness: awake and alert, oriented and patient cooperative Pain management: pain level controlled Vital Signs Assessment: post-procedure vital signs reviewed and stable Respiratory status: spontaneous breathing, nonlabored ventilation and respiratory function stable Cardiovascular status: blood pressure returned to baseline and stable Postop Assessment: adequate PO intake Anesthetic complications: no   No notable events documented.   Last Vitals:  Vitals:   06/15/23 0900 06/15/23 0919  BP: 114/72 114/80  Pulse: 84 81  Resp: 18 18  Temp: 36.8 C 36.8 C  SpO2: 97% 99%    Last Pain:  Vitals:   06/15/23 0919  TempSrc: Temporal  PainSc: 0-No pain                 Shamarie Call

## 2023-06-15 NOTE — Transfer of Care (Signed)
 Immediate Anesthesia Transfer of Care Note  Patient: April Daniels  Procedure(s) Performed: EXCISION OF NERVE MASS, CUTANEOUS NERVE, PERONEAL (Right: Leg Lower)  Patient Location: PACU  Anesthesia Type:General  Level of Consciousness: drowsy and patient cooperative  Airway & Oxygen Therapy: Patient Spontanous Breathing and Patient connected to face mask oxygen  Post-op Assessment: Report given to RN, Post -op Vital signs reviewed and stable, and Patient moving all extremities X 4  Post vital signs: Reviewed and stable  Last Vitals:  Vitals Value Taken Time  BP    Temp    Pulse 93 06/15/23 0840  Resp 15 06/15/23 0840  SpO2 96 % 06/15/23 0840  Vitals shown include unfiled device data.  Last Pain:  Vitals:   06/15/23 0622  TempSrc: Temporal  PainSc: 0-No pain         Complications: No notable events documented.

## 2023-06-16 ENCOUNTER — Ambulatory Visit: Payer: Self-pay | Admitting: Neurosurgery

## 2023-06-16 ENCOUNTER — Encounter: Payer: Self-pay | Admitting: Neurosurgery

## 2023-06-16 LAB — SURGICAL PATHOLOGY

## 2023-06-17 ENCOUNTER — Encounter: Payer: Self-pay | Admitting: Neurosurgery

## 2023-06-30 ENCOUNTER — Encounter: Payer: Self-pay | Admitting: Physician Assistant

## 2023-06-30 ENCOUNTER — Ambulatory Visit (INDEPENDENT_AMBULATORY_CARE_PROVIDER_SITE_OTHER): Payer: BC Managed Care – PPO | Admitting: Physician Assistant

## 2023-06-30 VITALS — BP 118/78 | Temp 98.3°F | Wt 190.0 lb

## 2023-06-30 DIAGNOSIS — Z09 Encounter for follow-up examination after completed treatment for conditions other than malignant neoplasm: Secondary | ICD-10-CM

## 2023-06-30 DIAGNOSIS — D3610 Benign neoplasm of peripheral nerves and autonomic nervous system, unspecified: Secondary | ICD-10-CM

## 2023-06-30 MED ORDER — CEPHALEXIN 500 MG PO CAPS: 500.0000 mg | ORAL_CAPSULE | Freq: Three times a day (TID) | ORAL | 0 refills | Status: AC

## 2023-06-30 NOTE — Progress Notes (Signed)
   REFERRING PHYSICIAN:  Clemens Curt, Md [address] [place],  [place] [address]  DOS: 06/15/2023, right peroneal schwannoma excision  HISTORY OF PRESENT ILLNESS: April Daniels is approximately 2 weeks status post right peroneal schwannoma excision. she is doing well.  The pain that was shooting down her leg has ceased.  Overall she feels that her pain is under control.  No issues with her gait or other complaints at this time.  Denies any fevers or chills.    PHYSICAL EXAMINATION:  General: Patient is well developed, well nourished, calm, collected, and in no apparent distress.   NEUROLOGICAL:  General: In no acute distress.   Awake, alert, oriented to person, place, and time.  Pupils equal round and reactive to light.  Facial tone is symmetric.     Strength:            Side Iliopsoas Quads Hamstring PF DF EHL  R 5 5 5 5 5 5   L 5 5 5 5 5 5    Incision has some surrounding erythema around the middle of her incision on the medial aspect.  She states this is new and that she just started noticing it yesterday.  Some mild drainage, no pus noted.  ROS (Neurologic):  Negative except as noted above  IMAGING: No new imaging prior to his appointment.   Pathology: Pathology was consistent with a schwannoma  ASSESSMENT/PLAN:  April Daniels is doing well approximately 2 weeks after right peroneal schwannoma excision.  Overall she is doing very well however does appear to have a superficial wound infection.  She states she did start noticing this within the past day.  Denies any fever or chills at this time.  Otherwise is doing very well.  Plan for short course of antibiotics which was sent to her preferred pharmacy.  In addition, Medihoney and dressing applied.  Patient counseled on red flag symptoms in terms of potential furthering of wound infection.  Plan to see back at this time in approximately 1 month.  Advised to contact us  sooner for any changes.   Advised to  contact the office if any questions or concerns arise.  Ludwig Safer PA-C Department of neurosurgery

## 2023-07-28 ENCOUNTER — Ambulatory Visit (INDEPENDENT_AMBULATORY_CARE_PROVIDER_SITE_OTHER): Payer: BC Managed Care – PPO | Admitting: Neurosurgery

## 2023-07-28 VITALS — BP 118/78 | Temp 98.2°F | Ht 62.0 in | Wt 190.0 lb

## 2023-07-28 DIAGNOSIS — D3613 Benign neoplasm of peripheral nerves and autonomic nervous system of lower limb, including hip: Secondary | ICD-10-CM

## 2023-07-28 DIAGNOSIS — D3610 Benign neoplasm of peripheral nerves and autonomic nervous system, unspecified: Secondary | ICD-10-CM

## 2023-07-28 DIAGNOSIS — Z09 Encounter for follow-up examination after completed treatment for conditions other than malignant neoplasm: Secondary | ICD-10-CM

## 2023-07-28 NOTE — Progress Notes (Signed)
   REFERRING PHYSICIAN:  Randeen Laine LABOR, Md [address] [place],  [place] [address]  DOS: 06/15/2023, right peroneal schwannoma excision  HISTORY OF PRESENT ILLNESS: April Daniels is approximately 6 weeks status post right peroneal schwannoma excision. she is doing well.  The pain that was shooting down her leg has ceased.  She has some incisional tenderness but states that this is continuing to get better.  Overall she is very pleased.    PHYSICAL EXAMINATION:  General: Patient is well developed, well nourished, calm, collected, and in no apparent distress.   NEUROLOGICAL:  General: In no acute distress.   Awake, alert, oriented to person, place, and time.  Pupils equal round and reactive to light.  Facial tone is symmetric.     Strength:            Side Iliopsoas Quads Hamstring PF DF EHL  R 5 5 5 5 5 5   L 5 5 5 5 5 5    Incision has some surrounding erythema around the middle of her incision on the medial aspect.  She states this is new and that she just started noticing it yesterday.  Some mild drainage, no pus noted.  ROS (Neurologic):  Negative except as noted above  IMAGING: No new imaging prior to his appointment.   Pathology: Pathology was consistent with a schwannoma  ASSESSMENT/PLAN:  April Daniels is doing well approximately 6 weeks after right peroneal schwannoma excision.  Overall doing very well.  She is pleased overall, her symptoms from preoperative time have gotten so much better and she is happy that she went through with the procedure.  She does have some incisional tenderness but this is getting better.  At this point she can continue to follow-up as needed  Penne MICAEL Sharps, MD Department of neurosurgery

## 2023-09-01 ENCOUNTER — Encounter: Payer: BC Managed Care – PPO | Admitting: Physician Assistant

## 2023-10-18 DIAGNOSIS — L821 Other seborrheic keratosis: Secondary | ICD-10-CM | POA: Diagnosis not present

## 2023-12-06 ENCOUNTER — Encounter: Payer: Self-pay | Admitting: Radiology

## 2023-12-22 ENCOUNTER — Other Ambulatory Visit: Payer: Self-pay | Admitting: Family Medicine

## 2023-12-23 NOTE — Telephone Encounter (Signed)
 Pt's due for a CPE (labs prior) or at least a med refill. Please schedule and then route back to me, thanks

## 2023-12-26 ENCOUNTER — Encounter: Payer: Self-pay | Admitting: Family Medicine

## 2024-01-14 ENCOUNTER — Other Ambulatory Visit: Payer: Self-pay | Admitting: Family Medicine

## 2024-01-14 DIAGNOSIS — Z1231 Encounter for screening mammogram for malignant neoplasm of breast: Secondary | ICD-10-CM

## 2024-01-23 ENCOUNTER — Telehealth: Payer: Self-pay | Admitting: Family Medicine

## 2024-01-23 DIAGNOSIS — E611 Iron deficiency: Secondary | ICD-10-CM

## 2024-01-23 DIAGNOSIS — Z0000 Encounter for general adult medical examination without abnormal findings: Secondary | ICD-10-CM

## 2024-01-23 DIAGNOSIS — E669 Obesity, unspecified: Secondary | ICD-10-CM | POA: Insufficient documentation

## 2024-01-23 DIAGNOSIS — Z131 Encounter for screening for diabetes mellitus: Secondary | ICD-10-CM | POA: Insufficient documentation

## 2024-01-23 NOTE — Telephone Encounter (Signed)
-----   Message from Harlene Du sent at 01/07/2024 10:06 AM EST ----- Regarding: Lab Wed 01/26/24 Hello,  Patient is coming in for CPE labs. Can we get orders please.   Thanks

## 2024-01-26 ENCOUNTER — Other Ambulatory Visit

## 2024-02-04 ENCOUNTER — Encounter: Admitting: Family Medicine

## 2024-02-09 ENCOUNTER — Ambulatory Visit: Payer: Self-pay | Admitting: Family Medicine

## 2024-02-09 ENCOUNTER — Other Ambulatory Visit

## 2024-02-09 DIAGNOSIS — E611 Iron deficiency: Secondary | ICD-10-CM

## 2024-02-09 DIAGNOSIS — Z131 Encounter for screening for diabetes mellitus: Secondary | ICD-10-CM | POA: Diagnosis not present

## 2024-02-09 DIAGNOSIS — Z0000 Encounter for general adult medical examination without abnormal findings: Secondary | ICD-10-CM

## 2024-02-09 DIAGNOSIS — E669 Obesity, unspecified: Secondary | ICD-10-CM

## 2024-02-09 LAB — CBC WITH DIFFERENTIAL/PLATELET
Basophils Absolute: 0 K/uL (ref 0.0–0.1)
Basophils Relative: 0.5 % (ref 0.0–3.0)
Eosinophils Absolute: 0.3 K/uL (ref 0.0–0.7)
Eosinophils Relative: 5.4 % — ABNORMAL HIGH (ref 0.0–5.0)
HCT: 40.4 % (ref 36.0–46.0)
Hemoglobin: 13.6 g/dL (ref 12.0–15.0)
Lymphocytes Relative: 39.1 % (ref 12.0–46.0)
Lymphs Abs: 2 K/uL (ref 0.7–4.0)
MCHC: 33.7 g/dL (ref 30.0–36.0)
MCV: 90.9 fl (ref 78.0–100.0)
Monocytes Absolute: 0.5 K/uL (ref 0.1–1.0)
Monocytes Relative: 9.8 % (ref 3.0–12.0)
Neutro Abs: 2.3 K/uL (ref 1.4–7.7)
Neutrophils Relative %: 45.2 % (ref 43.0–77.0)
Platelets: 286 K/uL (ref 150.0–400.0)
RBC: 4.44 Mil/uL (ref 3.87–5.11)
RDW: 13.1 % (ref 11.5–15.5)
WBC: 5.2 K/uL (ref 4.0–10.5)

## 2024-02-09 LAB — HEMOGLOBIN A1C: Hgb A1c MFr Bld: 5.3 % (ref 4.6–6.5)

## 2024-02-09 LAB — COMPREHENSIVE METABOLIC PANEL WITH GFR
ALT: 19 U/L (ref 3–35)
AST: 17 U/L (ref 5–37)
Albumin: 4.1 g/dL (ref 3.5–5.2)
Alkaline Phosphatase: 48 U/L (ref 39–117)
BUN: 12 mg/dL (ref 6–23)
CO2: 27 meq/L (ref 19–32)
Calcium: 8.8 mg/dL (ref 8.4–10.5)
Chloride: 106 meq/L (ref 96–112)
Creatinine, Ser: 0.76 mg/dL (ref 0.40–1.20)
GFR: 93.48 mL/min
Glucose, Bld: 89 mg/dL (ref 70–99)
Potassium: 4.3 meq/L (ref 3.5–5.1)
Sodium: 137 meq/L (ref 135–145)
Total Bilirubin: 0.4 mg/dL (ref 0.2–1.2)
Total Protein: 6.5 g/dL (ref 6.0–8.3)

## 2024-02-09 LAB — LIPID PANEL
Cholesterol: 186 mg/dL (ref 28–200)
HDL: 67.5 mg/dL
LDL Cholesterol: 102 mg/dL — ABNORMAL HIGH (ref 10–99)
NonHDL: 118.73
Total CHOL/HDL Ratio: 3
Triglycerides: 82 mg/dL (ref 10.0–149.0)
VLDL: 16.4 mg/dL (ref 0.0–40.0)

## 2024-02-09 LAB — TSH: TSH: 1.19 u[IU]/mL (ref 0.35–5.50)

## 2024-02-09 LAB — FERRITIN: Ferritin: 12.1 ng/mL (ref 10.0–291.0)

## 2024-02-09 LAB — IRON: Iron: 140 ug/dL (ref 42–145)

## 2024-02-14 ENCOUNTER — Ambulatory Visit: Admitting: Family Medicine

## 2024-02-14 ENCOUNTER — Encounter: Payer: Self-pay | Admitting: Family Medicine

## 2024-02-14 VITALS — BP 132/80 | HR 74 | Temp 98.0°F | Ht 62.25 in | Wt 193.5 lb

## 2024-02-14 DIAGNOSIS — D3610 Benign neoplasm of peripheral nerves and autonomic nervous system, unspecified: Secondary | ICD-10-CM | POA: Diagnosis not present

## 2024-02-14 DIAGNOSIS — E559 Vitamin D deficiency, unspecified: Secondary | ICD-10-CM | POA: Insufficient documentation

## 2024-02-14 DIAGNOSIS — E669 Obesity, unspecified: Secondary | ICD-10-CM

## 2024-02-14 DIAGNOSIS — E611 Iron deficiency: Secondary | ICD-10-CM | POA: Diagnosis not present

## 2024-02-14 DIAGNOSIS — Z0000 Encounter for general adult medical examination without abnormal findings: Secondary | ICD-10-CM

## 2024-02-14 DIAGNOSIS — Z131 Encounter for screening for diabetes mellitus: Secondary | ICD-10-CM | POA: Diagnosis not present

## 2024-02-14 DIAGNOSIS — Z1211 Encounter for screening for malignant neoplasm of colon: Secondary | ICD-10-CM | POA: Diagnosis not present

## 2024-02-14 DIAGNOSIS — F430 Acute stress reaction: Secondary | ICD-10-CM | POA: Diagnosis not present

## 2024-02-14 MED ORDER — ESCITALOPRAM OXALATE 20 MG PO TABS: 20.0000 mg | ORAL_TABLET | Freq: Every day | ORAL | 3 refills | Status: AC

## 2024-02-14 NOTE — Assessment & Plan Note (Signed)
 Continues lexapro  20 mg daily  Works well  Did not tolerate holding it   Will continue this  Encouraged good self care

## 2024-02-14 NOTE — Assessment & Plan Note (Signed)
 Last vitamin D  Lab Results  Component Value Date   VD25OH 29.6 (L) 12/29/2016   Plans to start 2000 international units D3 over the counter daily  For bone and overall health

## 2024-02-14 NOTE — Assessment & Plan Note (Signed)
 Referral done for screening colonoscopy  Pt will call [facility] to schedule

## 2024-02-14 NOTE — Assessment & Plan Note (Signed)
 Normal cbc and iron level today

## 2024-02-14 NOTE — Patient Instructions (Addendum)
 Think about flu and tetanus shot when you feel better   Make sure to follow up with your gyn     I put in referral for screening colonoscopy  Call  [facility]  [phone] to get on the schedule   Please start vitamin D3 2000 international units daily over the counter    Try and get more regular exercise  Find an indoor option Walking pad is great  Add some strength training to your routine, this is important for bone and brain health and can reduce your risk of falls and help your body use insulin properly and regulate weight  Light weights, exercise bands , and internet videos are a good way to start  Yoga (chair or regular), machines , floor exercises or a gym with machines are also good options    Take care of yourself

## 2024-02-14 NOTE — Assessment & Plan Note (Signed)
 Discussed how this problem influences overall health and the risks it imposes  Reviewed plan for weight loss with lower calorie diet (via better food choices (lower glycemic and portion control) along with exercise building up to or more than 30 minutes 5 days per week including some aerobic activity and strength training
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
## 2024-02-14 NOTE — Assessment & Plan Note (Signed)
 Reviewed health habits including diet and exercise and skin cancer prevention Reviewed appropriate screening tests for age  Also reviewed health mt list, fam hx and immunization status , as well as social and family history   See HPI Labs reviewed and ordered Health Maintenance  Topic Date Due   Hepatitis C Screening  Never done   Pneumococcal Vaccine (1 of 2 - PCV) Never done   Hepatitis B Vaccine (1 of 3 - 19+ 3-dose series) Never done   Colon Cancer Screening  Never done   Breast Cancer Screening  12/18/2023   Pap with HPV screening  03/21/2024   Flu Shot  05/02/2024*   DTaP/Tdap/Td vaccine (3 - Td or Tdap) 02/13/2025*   COVID-19 Vaccine (4 - 2025-26 season) 03/01/2025*   HIV Screening  Completed   HPV Vaccine  Aged Out   Meningitis B Vaccine  Aged Out  *Topic was postponed. The date shown is not the original due date.    Declines flu and tetanus shot-thinks she may be coming down with a virus and wants to wait until she knows  Plans gyn follow up  Screening colonoscopy ordered  Discussed fall prevention, supplements and exercise for bone density  Utd derm care/screening  PHQ 2 due to sleep issues

## 2024-02-14 NOTE — Assessment & Plan Note (Signed)
 Peroneal Had surgery 06/2023

## 2024-02-14 NOTE — Assessment & Plan Note (Signed)
 Lab Results  Component Value Date   HGBA1C 5.3 02/09/2024   HGBA1C 5.5 12/29/2016   disc imp of low glycemic diet and wt loss to prevent DM2

## 2024-02-16 ENCOUNTER — Ambulatory Visit
Admission: RE | Admit: 2024-02-16 | Discharge: 2024-02-16 | Disposition: A | Discharge status: Home / Self Care | Source: Ambulatory Visit | Attending: Family Medicine | Admitting: Family Medicine

## 2024-02-16 DIAGNOSIS — Z1231 Encounter for screening mammogram for malignant neoplasm of breast: Secondary | ICD-10-CM | POA: Insufficient documentation

## 2024-02-20 ENCOUNTER — Ambulatory Visit: Payer: Self-pay | Admitting: Family Medicine

## 2024-03-08 ENCOUNTER — Encounter: Payer: Self-pay | Admitting: Pediatrics

## 2024-03-10 ENCOUNTER — Encounter: Payer: Self-pay | Admitting: Family Medicine

## 2024-03-10 ENCOUNTER — Ambulatory Visit: Admission: RE | Admit: 2024-03-10 | Source: Ambulatory Visit

## 2024-03-10 ENCOUNTER — Ambulatory Visit: Admitting: Family Medicine

## 2024-03-10 VITALS — BP 116/70 | HR 73 | Temp 99.2°F | Ht 62.25 in | Wt 197.4 lb

## 2024-03-10 DIAGNOSIS — M79645 Pain in left finger(s): Secondary | ICD-10-CM | POA: Insufficient documentation

## 2024-03-10 NOTE — Assessment & Plan Note (Signed)
 Of 2 wk duration and worsening in left handed female  No trauma or overuse known Triggered by writing and resisted abd/adduction  Reassuring exam , no neurologic changes  Xray ordered Instructed to try over the counter voltaren gel 1% Ice/cold compress Avoid painful activities

## 2024-03-10 NOTE — Patient Instructions (Signed)
 Use a cold compress on the painful area for 10 minutes when you can (especially before or after writing)   Try some voltaren gel 1% over the counter to affected area up to four times daily as needed   Xray of hand today  We will reach out with result and plan    If symptoms worsen before then let us  know
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
## 2024-03-20 ENCOUNTER — Encounter

## 2024-04-03 ENCOUNTER — Encounter: Admitting: Pediatrics
